# Patient Record
Sex: Male | Born: 1960 | Race: White | Hispanic: No | Marital: Married | Smoking: Never smoker
Health system: Southern US, Community
[De-identification: ages and names within clinical notes are randomized; demographics above are authoritative.]

## PROBLEM LIST (undated history)

## (undated) DIAGNOSIS — R112 Nausea with vomiting, unspecified: Secondary | ICD-10-CM

## (undated) DIAGNOSIS — E785 Hyperlipidemia, unspecified: Secondary | ICD-10-CM

## (undated) DIAGNOSIS — Z9889 Other specified postprocedural states: Secondary | ICD-10-CM

## (undated) DIAGNOSIS — K449 Diaphragmatic hernia without obstruction or gangrene: Secondary | ICD-10-CM

## (undated) DIAGNOSIS — K219 Gastro-esophageal reflux disease without esophagitis: Secondary | ICD-10-CM

## (undated) DIAGNOSIS — I10 Essential (primary) hypertension: Secondary | ICD-10-CM

## (undated) DIAGNOSIS — K5732 Diverticulitis of large intestine without perforation or abscess without bleeding: Secondary | ICD-10-CM

## (undated) HISTORY — DX: Gastro-esophageal reflux disease without esophagitis: K21.9

## (undated) HISTORY — DX: Hyperlipidemia, unspecified: E78.5

## (undated) HISTORY — DX: Diverticulitis of large intestine without perforation or abscess without bleeding: K57.32

## (undated) HISTORY — DX: Diaphragmatic hernia without obstruction or gangrene: K44.9

## (undated) HISTORY — DX: Essential (primary) hypertension: I10

## (undated) HISTORY — PX: ESOPHAGOGASTRODUODENOSCOPY: SHX1529

---

## 2000-05-14 ENCOUNTER — Encounter: Payer: Self-pay | Admitting: Emergency Medicine

## 2000-05-14 ENCOUNTER — Emergency Department (HOSPITAL_COMMUNITY): Admission: EM | Admit: 2000-05-14 | Discharge: 2000-05-14 | Payer: Self-pay | Admitting: Emergency Medicine

## 2000-05-15 ENCOUNTER — Encounter: Payer: Self-pay | Admitting: Urology

## 2000-05-15 ENCOUNTER — Ambulatory Visit (HOSPITAL_COMMUNITY): Admission: RE | Admit: 2000-05-15 | Discharge: 2000-05-15 | Payer: Self-pay | Admitting: Urology

## 2000-05-27 ENCOUNTER — Encounter: Admission: RE | Admit: 2000-05-27 | Discharge: 2000-05-27 | Payer: Self-pay | Admitting: Urology

## 2000-05-27 ENCOUNTER — Encounter: Payer: Self-pay | Admitting: Urology

## 2001-02-16 ENCOUNTER — Ambulatory Visit (HOSPITAL_COMMUNITY): Admission: RE | Admit: 2001-02-16 | Discharge: 2001-02-16 | Payer: Self-pay | Admitting: Gastroenterology

## 2005-01-22 ENCOUNTER — Ambulatory Visit: Payer: Self-pay | Admitting: Family Medicine

## 2005-03-12 ENCOUNTER — Ambulatory Visit: Payer: Self-pay | Admitting: Family Medicine

## 2005-04-15 ENCOUNTER — Ambulatory Visit: Payer: Self-pay | Admitting: Family Medicine

## 2005-06-05 ENCOUNTER — Ambulatory Visit: Payer: Self-pay | Admitting: Family Medicine

## 2005-08-15 ENCOUNTER — Ambulatory Visit: Payer: Self-pay | Admitting: Family Medicine

## 2005-11-26 ENCOUNTER — Ambulatory Visit: Payer: Self-pay | Admitting: Family Medicine

## 2006-03-13 ENCOUNTER — Ambulatory Visit: Payer: Self-pay | Admitting: Family Medicine

## 2006-04-30 ENCOUNTER — Ambulatory Visit: Payer: Self-pay | Admitting: Family Medicine

## 2006-07-12 ENCOUNTER — Encounter (INDEPENDENT_AMBULATORY_CARE_PROVIDER_SITE_OTHER): Payer: Self-pay | Admitting: Specialist

## 2006-07-12 ENCOUNTER — Ambulatory Visit (HOSPITAL_COMMUNITY): Admission: EM | Admit: 2006-07-12 | Discharge: 2006-07-13 | Payer: Self-pay | Admitting: Emergency Medicine

## 2006-09-02 ENCOUNTER — Ambulatory Visit: Payer: Self-pay | Admitting: Family Medicine

## 2007-01-13 ENCOUNTER — Ambulatory Visit: Payer: Self-pay | Admitting: Family Medicine

## 2007-06-02 ENCOUNTER — Ambulatory Visit: Payer: Self-pay | Admitting: Family Medicine

## 2007-07-08 IMAGING — CT CT ABDOMEN W/ CM
2 of 5 series · 17 of 46 positions shown, 19 images · IV contrast (APPLIED)
Comparison: None.

ABDOMEN CT WITH CONTRAST:

CLINICAL DATA: Abdominal pain.
TECHNIQUE: Multidetector CT imaging of the abdomen and pelvis was performed
following the standard protocol during bolus administration of intravenous
contrast.

Contrast:  125 cc Omnipaque 300

[Series 2: abd_pel 5.0 b40f st · axial · 0.68mm/px · z∈[-470,-50]mm · 14 of 96 slices shown, 16 images]
[im 6/96  soft-tissue]
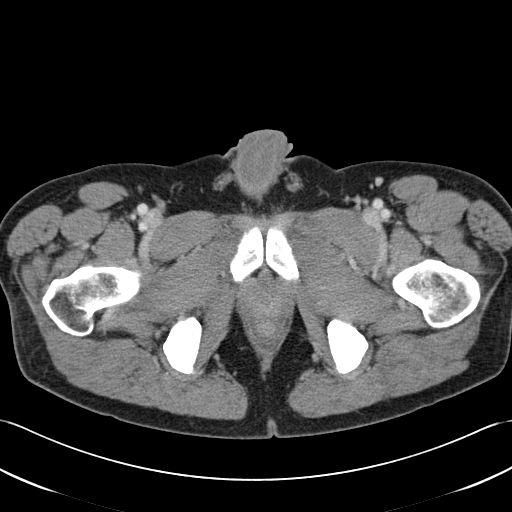
[im 6/96  bone]
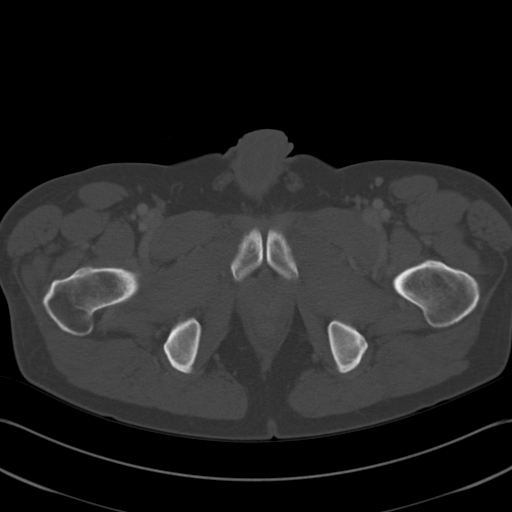
[im 11/96  soft-tissue]
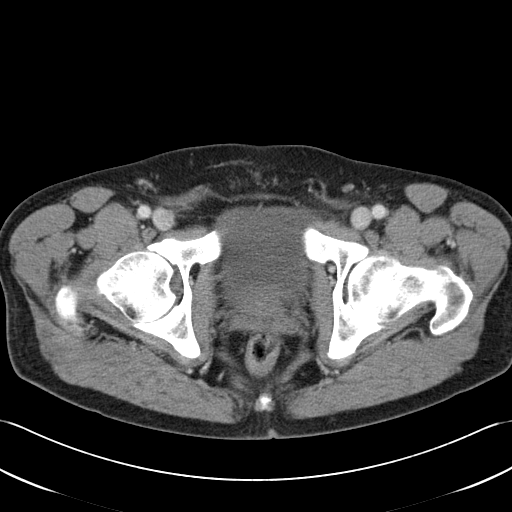
[im 22/96  soft-tissue]
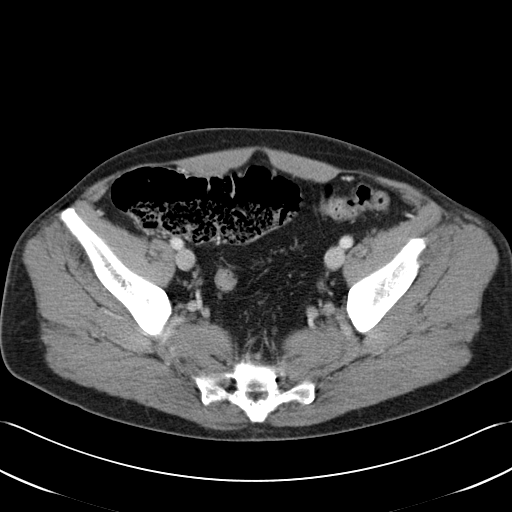
[im 27/96  soft-tissue]
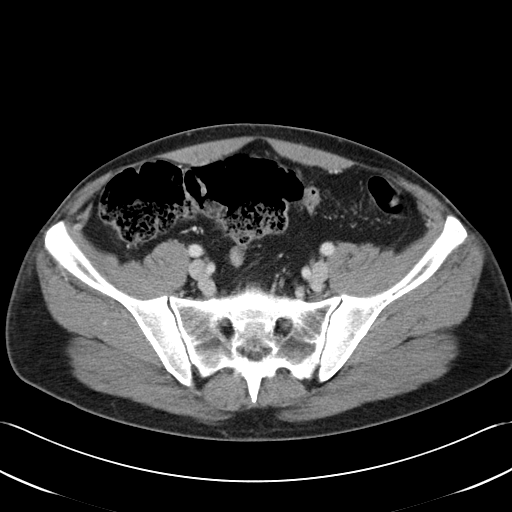
[im 32/96  soft-tissue]
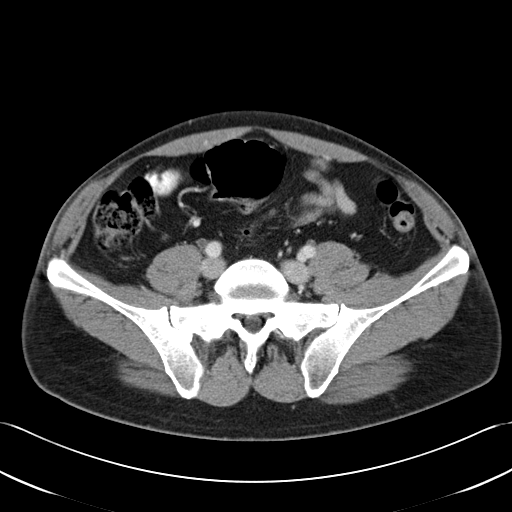
[im 37/96  soft-tissue]
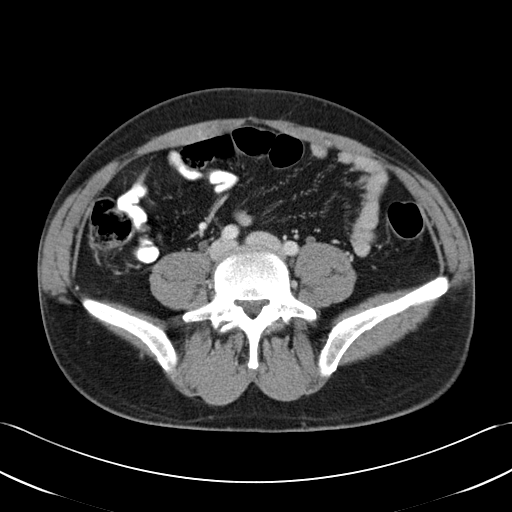
[im 43/96  soft-tissue]
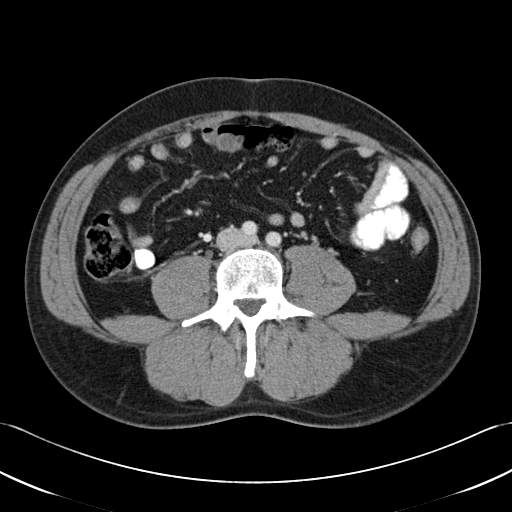
[im 53/96  soft-tissue]
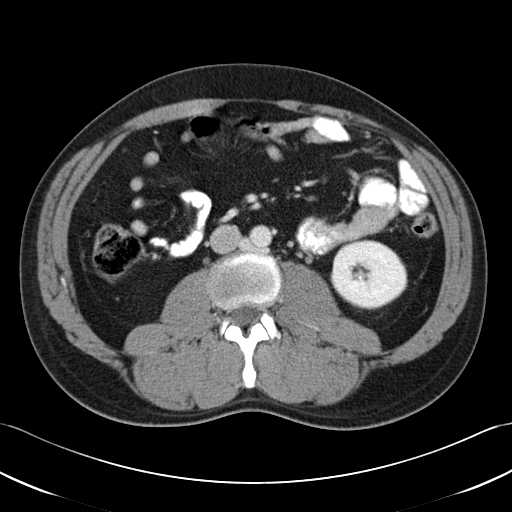
[im 59/96  soft-tissue]
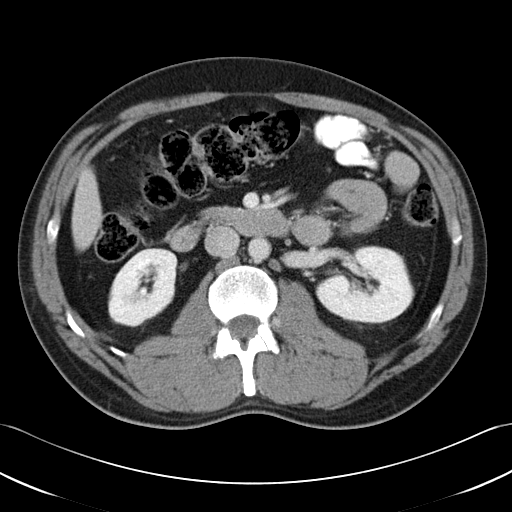
[im 59/96  bone]
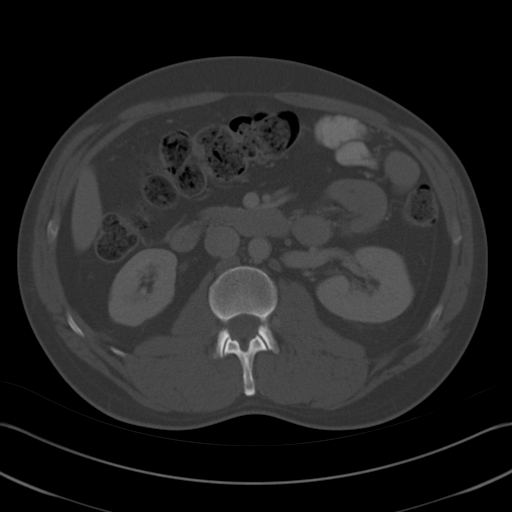
[im 64/96  soft-tissue]
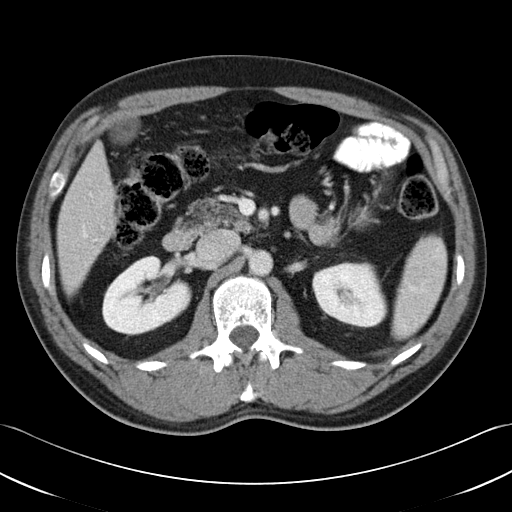
[im 69/96  soft-tissue]
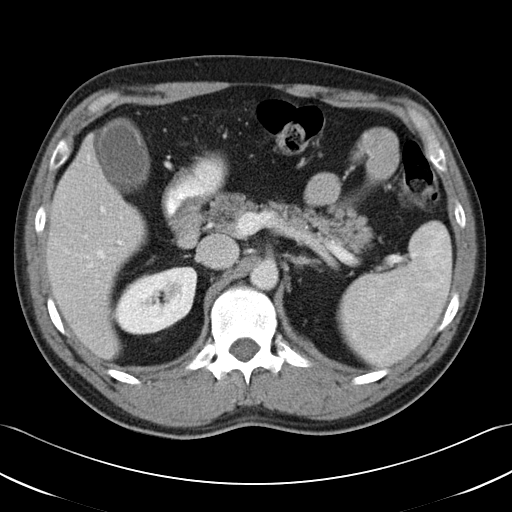
[im 74/96  soft-tissue]
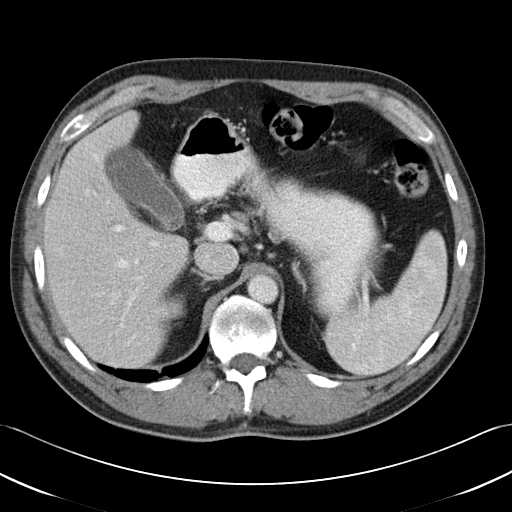
[im 85/96  soft-tissue]
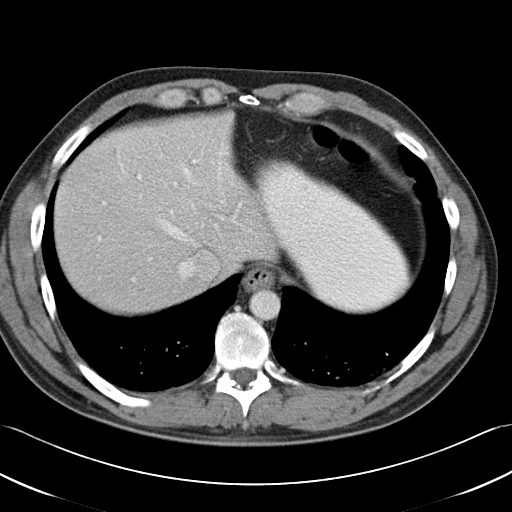
[im 90/96  soft-tissue]
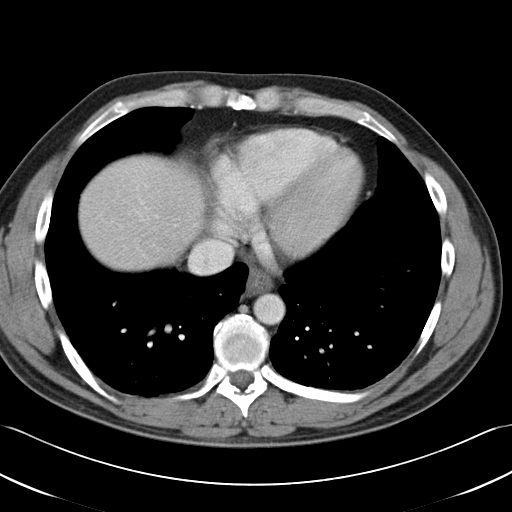

[Series 602: coronal abdomen · coronal · 0.97mm/px · 3 of 125 slices shown]
[im 42/125  soft-tissue]
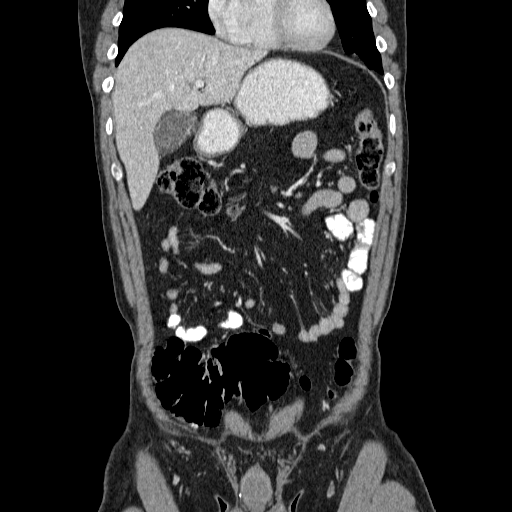
[im 56/125  soft-tissue]
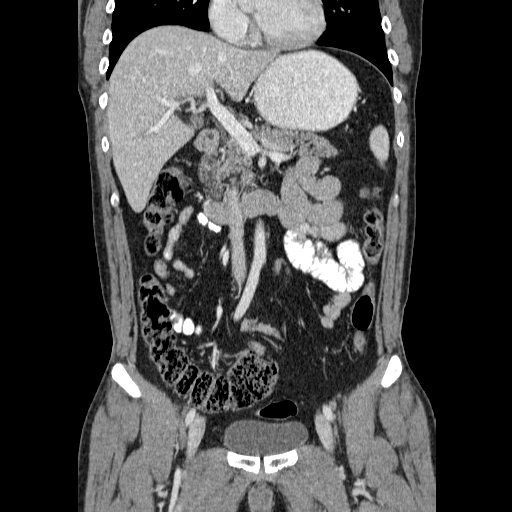
[im 69/125  soft-tissue]
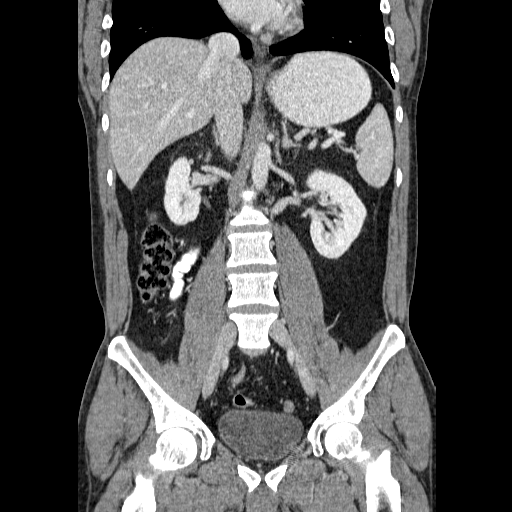

[17 of 46 positions shown; findings below may reference images not displayed]

FINDINGS: The liver, spleen, stomach, duodenum, pancreas, adrenal glands, and
kidneys are unremarkable. No free fluid, lymphadenopathy, or abdominal aortic
aneurysm.

Gallstones are noted in the gallbladder lumen. There is irregular gallbladder
wall thickening.
IMPRESSION: Gallstones with gallbladder wall thickening and pericholecystic edema. Features
are suspicious for acute cholecystitis and clinical correlation is recommended.

PELVIS CT WITH CONTRAST:

No lymphadenopathy or free fluid. Diverticular change noted in the sigmoid colon
without diverticulitis. The appendix is not visualized but there is no evidence
for right lower quadrant inflammation.
IMPRESSION: No acute findings in the anatomic pelvis. 

This report was called directly to Dr. Takiyah at the time of study
interpretation.

## 2011-05-10 NOTE — Consult Note (Signed)
Brian Walters, [name]:  [id number]   MEDICAL RECORD NO.:  [id number]          PATIENT TYPE:  EMS   LOCATION:  ED                           FACILITY:  [facility]   PHYSICIAN:  Lebron Conners, M.D.   DATE OF BIRTH:  1961/08/06   DATE OF CONSULTATION:  07/12/2006  DATE OF DISCHARGE:                                   CONSULTATION   CHIEF COMPLAINT:  Abdominal and back pain.   PRESENT ILLNESS:  Brian Walters is a healthy 50 year old man who was awakened  from sleep about 1:30 in the morning with severe right upper quadrant and  right-sided back pain.  It was so bad and he could not get relief that he  came to the emergency department.  He was given a pain shot which relieved  the pain and is now totally pain free.  He vomited several times.  He had  never had any similar attacks of illness before.  He underwent lab testing  which was all normal as far as CBC, serum chemistries, liver tests and  urinalysis.  He had a CT scan of the abdomen which showed gallstones with  thickened gallbladder wall consistent with acute cholecystitis.  He has no  history of jaundice, dark urine or light stools.   PAST MEDICAL HISTORY:  He has had no major illnesses or operations.  He is  on cholesterol-reducing medication for hyperlipidemia.  He has had no major  operations.  He is a smoker.  Does not drink alcoholic beverages.  He takes  no nonprescription drugs regularly.   FAMILY HISTORY:  Unremarkable.   REVIEW OF SYSTEMS:  He has had some indigestion and indigestion, not severe,  just lately.  The remainder of the review of systems is unremarkable  including symptoms of cardiopulmonary disease.   PHYSICAL EXAMINATION:  VITAL SIGNS:  Temperature is normal.  Vital signs  normal per nursing report.  GENERAL:  Patient is in no acute distress.  He is conversant, and mental  status is normal.  HEENT:  Unremarkable.  CHEST:  His chest is clear to auscultation.  HEART:  The heart rate  and rhythm are normal.  No murmur or gallop is heard.  ABDOMEN:  Completely nontender, nondistended, normal bowel sounds.  No  masses or organomegaly.  No hernias.  EXTREMITIES:  Normal with good pulses, no edema, no skin lesions.  SKIN:  No lesions noted.  LYMPH NODES:  Not enlarged in the groin, axilla, or neck.  NEUROLOGICAL:  Grossly normal.   IMPRESSION:  Acute cholecystitis and cholelithiasis, resolving.   PLAN:  I have talked to the patient and his wife at length about the  advantages of immediate operation versus elective surgery.  I mentioned  possibilities of pancreatitis and common duct obstruction which might lead  to jaundice and more serious trouble.  I mentioned the possibility that he  would have further attacks at inconvenient time.  The patient is feeling  very well right now, and he has just started a new very responsible job  which he wants work at full-time if he can.  I  believe it is alright to give  him a narcotic prescription, and I will give him some  Vicodin and to observe for the time being.  I have urged him to electively  schedule laparoscopic cholecystectomy in the not distant future.  We will  provide him with a pamphlet on gallstone disease and and will schedule the  operation when he wants to do so and will be happy to see him again at any  time in the future.      Lebron Conners, M.D.  Electronically Signed     WB/MEDQ  D:  07/12/2006  T:  07/12/2006  Job:  [id number]

## 2011-05-10 NOTE — Op Note (Signed)
NAMEOTTO, FELKINS              ACCOUNT NO.:  [id number]   MEDICAL RECORD NO.:  [id number]          PATIENT TYPE:  INP   LOCATION:  1617                         FACILITY:  [facility]   PHYSICIAN:  Lebron Conners, M.D.   DATE OF BIRTH:  18-May-1961   DATE OF PROCEDURE:  07/13/2006  DATE OF DISCHARGE:  07/13/2006                                 OPERATIVE REPORT   PREOPERATIVE DIAGNOSIS:  Cholelithiasis and acute cholecystitis.   POSTOPERATIVE DIAGNOSIS:  Cholelithiasis and acute cholecystitis.   OPERATION:  Laparoscopic cholecystectomy.   SURGEON:  Dr. Orson Slick   ASSISTANT:  Wilmon Arms. Tsuei, M.D.   ANESTHESIA:  General and local.   BLOOD LOSS:  About 200 mL.   CONDITION PACU:  Good.   COMPLICATIONS:  None.   PROCEDURE:  After the patient was monitored and asleep and had routine  preparation and draping of the abdomen.  I made a short infraumbilical  incision through a site where I infiltrated local anesthetic.  I cut the  midline fascia for about 2 cm and bluntly entered the peritoneum.  I placed  0 Vicryl pursestring suture and secured a Hassan cannula and inflated the  abdomen with carbon dioxide.  I saw no abnormalities on laparoscopy except  for a distended gallbladder.  I anesthetized three additional sites and  placed three ports under direct view noting that there was no injury to the  viscera.  I then had the patient positioned head-up foot-down tilted to the  left and I drained the gallbladder with a suction aspirator.  I then grasped  the fundus and elevated it toward the right shoulder.  There were no  adhesions of omentum to the gallbladder.  There was good deal of edema  around the infundibulum and hepatoduodenal ligament.  I carefully dissected  in that area and I saw that the cystic duct emerging from the infundibulum.  I was searching for the cystic artery when some bleeding occurred and I was  having a little difficulty seeing it for control.  I went ahead and  dissected the cystic duct a bit further then clipped it with four clips and  divided it as I saw it clearly emerging from the infundibulum.  I then was  able to dissect out the cystic artery and clipped and divided and get good  hemostasis.  I very carefully examined the cystic duct stump and saw no  evidence that there was any common duct injury.  Subsequently after removal  of the gallbladder, I examined the infundibulum and area of transection and  there was a single-lumen appropriate in size and no evidence of any duct  injury.  I then dissected the gallbladder from the liver using the cautery  and gained hemostasis with cautery.  I thought there was still maybe slight  oozing around the area of the cystic artery and so I packed Surgicel to  that.  Prior to that I thoroughly irrigated the area and removed irrigant.  After I was satisfied with hemostasis and the clips were secure and that  there was no leakage of bile.  I removed  the gallbladder from the abdomen in  a plastic pouch through the  umbilical incision and tied the pursestring suture.  I then removed the  lateral ports under direct view and saw no bleeding from the belly wall.  I  then closed all skin incisions with intracuticular Vicryl after allowing the  carbon dioxide to escape and removing epigastric port.  The patient  tolerated the operation well.      Lebron Conners, M.D.  Electronically Signed     WB/MEDQ  D:  07/13/2006  T:  07/13/2006  Job:  [id number]

## 2018-02-13 LAB — HM HEPATITIS C SCREENING LAB: HM Hepatitis Screen: NEGATIVE

## 2018-10-02 ENCOUNTER — Telehealth: Payer: Self-pay | Admitting: *Deleted

## 2018-10-02 NOTE — Telephone Encounter (Signed)
REFERRAL SENT TO SCHEDULING AND NOTES ON FILE FROM [facility] [phone].

## 2018-11-18 ENCOUNTER — Ambulatory Visit: Payer: BC Managed Care – PPO | Admitting: Cardiology

## 2018-11-18 ENCOUNTER — Encounter: Payer: Self-pay | Admitting: Cardiology

## 2018-11-18 DIAGNOSIS — I10 Essential (primary) hypertension: Secondary | ICD-10-CM | POA: Diagnosis not present

## 2018-11-18 DIAGNOSIS — I479 Paroxysmal tachycardia, unspecified: Secondary | ICD-10-CM

## 2018-11-18 DIAGNOSIS — R079 Chest pain, unspecified: Secondary | ICD-10-CM | POA: Insufficient documentation

## 2018-11-18 DIAGNOSIS — E785 Hyperlipidemia, unspecified: Secondary | ICD-10-CM | POA: Diagnosis not present

## 2018-11-18 DIAGNOSIS — R0602 Shortness of breath: Secondary | ICD-10-CM

## 2018-11-18 NOTE — Assessment & Plan Note (Signed)
Clearly his blood pressure not well enough controlled on just amlodipine alone.  Unfortunately when to see how he does it without the amlodipine since he thinks he is having side effects from it. Plan: Hold amlodipine for 2 days and then restart while not on Protonix.  If his symptoms recur, then we will switch from amlodipine to carvedilol 6.25 mg twice daily.  If symptoms do not recur, continue on amlodipine with plans to potentially add carvedilol on follow-up.

## 2018-11-18 NOTE — Patient Instructions (Signed)
Medication Instructions:   DO NOT TAKE  PROTONIX OR AMLODIPINE  FOR NOW  DO NOT TAKE AMLODIPINE FOR 3 DAYS , THEN RESTART  AMLODIPINE  IF SYMPTOMS DO NOT RETURN CONTINUE WITH THE AMLODIPINE IF SYMPTOMS RETURN CALL THE OFFICE  TO INFORM AND MEDICATION WILL BE CHANGED TO SOMETHING ELSE- DO NOT GO WITHOUT TAKING THIS SOMETHING      If you need a refill on your cardiac medications before your next appointment, please call your pharmacy.   Lab work:  NOT NEEDED  If you have labs (blood work) drawn today and your tests are completely normal, you will receive your results only by: Marland Kitchen. MyChart Message (if you have MyChart) OR . A paper copy in the mail If you have any lab test that is abnormal or we need to change your treatment, we will call you to review the results.  Testing/Procedures:  SCHEDULE AT [address] Your physician has requested that you have en exercise stress myoview. For further information please visit [email or web address]. Please follow instruction sheet, as given.    Follow-Up: At [facility], you and your health needs are our priority.  As part of our continuing mission to provide you with exceptional heart care, we have created designated Provider Care Teams.  These Care Teams include your primary Cardiologist (physician) and Advanced Practice Providers (APPs -  Physician Assistants and Nurse Practitioners) who all work together to provide you with the care you need, when you need it. You will need a follow up appointment in 2 months JAN 2020.  Please call our office 2 months in advance to schedule this appointment.  You may see Bryan Lemmaavid Harding, MD or one of the following Advanced Practice Providers on your designated Care Team:   Theodore DemarkRhonda Barrett, PA-C . Joni ReiningKathryn Lawrence, DNP, ANP  Any Other Special Instructions Will Be Listed Below (If Applicable).

## 2018-11-18 NOTE — Assessment & Plan Note (Signed)
Intermittent episodes of tachycardia.  Listen to this again in follow-up and and determine if any further needs to be done, but plan for now will be to actually see if he does okay with amlodipine alone, if not would start carvedilol 6.25 mg twice daily. If symptoms are worse, would probably consider monitor.

## 2018-11-18 NOTE — Assessment & Plan Note (Signed)
Has been on Livalo, now currently on pravastatin.  He has been on them for long enough time and I do not think that his new symptoms are related to pravastatin.  We will continue to monitor.

## 2018-11-18 NOTE — Assessment & Plan Note (Signed)
Hard to figure out with the symptoms are.  Taking away the fact it is not exacerbated with activity, it does sound like it could be atypical anginal type symptoms. With hyper tension and hyperlipidemia in a 57 year old gentleman, at least moderate risk for cardiac etiology. Plan: Treadmill Myoview.  Would like to see his exercise tolerance, but also think that are warranted

## 2018-11-18 NOTE — Assessment & Plan Note (Signed)
At this point, will evaluate for ischemia.  We will do this with a Myoview stress test, this will allow us to see his overall cardiac function as well as any potential ischemia.

## 2018-11-18 NOTE — Progress Notes (Signed)
PCP: Brian Catching, MD  Clinic Note: Chief Complaint  Patient presents with  . New Patient (Initial Visit)    Chest pain/throat pain    HPI: Brian Walters is a 57 y.o. male with a PMH notable for GERD / Esophagitis who presents today for the evaluation of chest pain and shortness of breath at the request of Brian Catching, MD.  TEE RICHESON was last seen on 10/12/2018: HPI states:  "Patient comes in for follow up, saw the PA a few weeks ago who increased his hydrochlorothiazide to a whole tablet and increased omeprazole to 20mg  BID. yesterday when eating a peanut butter sandwich he burped a lot and felt it coming back up his throat. Doesn't feel like food gets stuck or lodged. The pain from his stomach moves up to his throat.feels pressure in his stomach. Feels like he cant breathe when this happens. He gets the same feeling every time he takes hydrochlorothiazide. He went to the ER before he saw the PA and they did two ECGs with no significant change and trended troponins which were negative. He also brings in his Bps today which have been elevated.his bowels were loose as well this morning" -- converted to Protonix 40 mg, d/c HCTZ & started amlodipine 5 mg.  PRN [facility] + GI referral.  Recent Hospitalizations:  ER visit September 04, 2018: Thought to be viral gastroenteritis with shortness of breath.  Studies Personally Reviewed - (if available, images/films reviewed: From Epic Chart or Care Everywhere)  None    Interval History: Brian Walters presents here today stating that he still having episodes of difficulty breathing and choking sensation.  He says that whenever he takes either 1 of his medicines, he feels this choking sensation up in his throat that he cannot catch his breath.  Is satiated occasionally with tightness in his chest.  He also notes that whenever he touched tries to eat or drink something he will feel like he loses his breath.  He does note having  history of deviated septum, but has not had similar symptoms with this in the past.  He does not notice that he has exertional dyspnea, in fact he says that sometimes when he gets up walks around he gets better.  He never has chest tightness without dyspnea.  He says that with these episodes he just feels chills all over and the tingling sensation of chills that starts in the inside and goes out to his hands and legs lasting several minutes for goes away.  No fevers He says occasionally he feels his heart rate going up fast spontaneously almost like he is getting excited about something and then accompanied by down again.  It does not seem to be beating irregular, just fast.  Basically it sounds like he thinks that all of his symptoms are related to different medications that he is on.  He does not leave the Protonix is doing any good, and states that he is really having similar symptoms whether he is taking the amlodipine or the HCTZ.  He denies any PND orthopnea.  No edema.  Although he notes rapid heart rates he denies any syncope or near syncope just some lightheadedness.  No TIA or amaurosis fugax.  No claudication.  ROS: A comprehensive was performed.  Pertinent symptoms noted above Review of Systems  Constitutional: Positive for chills and malaise/fatigue. Negative for fever.  HENT: Positive for congestion. Negative for nosebleeds.   Respiratory: Positive for cough and sputum production.  Gastrointestinal: Negative for blood in stool and melena.  Genitourinary: Negative for dysuria and hematuria.  Musculoskeletal: Positive for back pain. Negative for joint pain.  Neurological: Positive for dizziness, tingling and weakness (Global). Negative for focal weakness.  Endo/Heme/Allergies: Positive for environmental allergies (Possibly).  Psychiatric/Behavioral: The patient is nervous/anxious.   All other systems reviewed and are negative.   I have reviewed and (if needed) personally updated  the patient's problem list, medications, allergies, past medical and surgical history, social and family history.   History reviewed. No pertinent past medical history.  History reviewed. No pertinent surgical history.  Current Meds  Medication Sig  . amLODipine (NORVASC) 5 MG tablet Take 2.5 mg by mouth daily.  . pantoprazole (PROTONIX) 40 MG tablet Take 40 mg by mouth daily.  . pravastatin (PRAVACHOL) 80 MG tablet Take 80 mg by mouth daily.    Not on File  Social History   Tobacco Use  . Smoking status: Never Smoker  . Smokeless tobacco: Never Used  Substance Use Topics  . Alcohol use: Not on file  . Drug use: Never   Social History   Social History Narrative  . Not on file    family history includes Diabetes in his mother; Heart attack in his father; Hypertension in his maternal grandfather and mother; Stroke in his maternal grandfather.  Wt Readings from Last 3 Encounters:  11/18/18 166 lb (75.3 kg)    PHYSICAL EXAM BP (!) 158/100   Pulse 89   Ht 5\' 8"  (1.727 m)   Wt 166 lb (75.3 kg)   BMI 25.24 kg/m  Physical Exam  Constitutional: He is oriented to person, place, and time. He appears well-developed and well-nourished. No distress.  Well-nourished, well-groomed  HENT:  Head: Normocephalic and atraumatic.  Mouth/Throat: Oropharynx is clear and moist.  Eyes: Pupils are equal, round, and reactive to light. Conjunctivae and EOM are normal.  Neck: Normal range of motion. Neck supple. No hepatojugular reflux and no JVD present. Carotid bruit is not present. No thyromegaly present.  Cardiovascular: Regular rhythm and intact distal pulses.  Occasional extrasystoles are present. Tachycardia present. PMI is not displaced. Exam reveals distant heart sounds. Exam reveals no gallop and no friction rub.  No murmur heard. Heart rate seems faster on exam the noted on vital signs and EKG.  Pulmonary/Chest: Effort normal and breath sounds normal. No respiratory distress. He  has no wheezes. He has no rales. He exhibits tenderness.  Abdominal: Soft. Bowel sounds are normal. He exhibits no distension. There is no tenderness. There is no rebound.  No HSM.  Musculoskeletal: Normal range of motion. He exhibits edema (Trivial). He exhibits no deformity.  Neurological: He is alert and oriented to person, place, and time. No cranial nerve deficit.  Skin: Skin is warm and dry. No rash noted. No erythema.  Psychiatric: He has a normal mood and affect. His behavior is normal. Judgment and thought content normal.     Adult ECG Report  Rate: 89;  Rhythm: normal sinus rhythm and Normal axis, intervals and durations.;   Narrative Interpretation: Normal EKG   Other studies Reviewed: Additional studies/ records that were reviewed today include:  Recent Labs:  No results found for: CREATININE, BUN, NA, K, CL, CO2 No results found for: CHOL, HDL, LDLCALC, LDLDIRECT, TRIG, CHOLHDL     ASSESSMENT / PLAN: Problem List Items Addressed This Visit    Chest pain with moderate risk for cardiac etiology    Hard to figure out with the symptoms  are.  Taking away the fact it is not exacerbated with activity, it does sound like it could be atypical anginal type symptoms. With hyper tension and hyperlipidemia in a 57 year old gentleman, at least moderate risk for cardiac etiology. Plan: Treadmill Myoview.  Would like to see his exercise tolerance, but also think that are warranted      Relevant Orders   EKG 12-Lead   MYOCARDIAL PERFUSION IMAGING   Essential hypertension (Chronic)    Clearly his blood pressure not well enough controlled on just amlodipine alone.  Unfortunately when to see how he does it without the amlodipine since he thinks he is having side effects from it. Plan: Hold amlodipine for 2 days and then restart while not on Protonix.  If his symptoms recur, then we will switch from amlodipine to carvedilol 6.25 mg twice daily.  If symptoms do not recur, continue on  amlodipine with plans to potentially add carvedilol on follow-up.      Relevant Medications   amLODipine (NORVASC) 5 MG tablet   pravastatin (PRAVACHOL) 80 MG tablet   Hyperlipidemia with target LDL less than 100 (Chronic)    Has been on Livalo, now currently on pravastatin.  He has been on them for long enough time and I do not think that his new symptoms are related to pravastatin.  We will continue to monitor.      Relevant Medications   amLODipine (NORVASC) 5 MG tablet   pravastatin (PRAVACHOL) 80 MG tablet   Paroxysmal tachycardia (HCC)    Intermittent episodes of tachycardia.  Listen to this again in follow-up and and determine if any further needs to be done, but plan for now will be to actually see if he does okay with amlodipine alone, if not would start carvedilol 6.25 mg twice daily. If symptoms are worse, would probably consider monitor.      Relevant Medications   amLODipine (NORVASC) 5 MG tablet   pravastatin (PRAVACHOL) 80 MG tablet   Other Relevant Orders   EKG 12-Lead   MYOCARDIAL PERFUSION IMAGING   Shortness of breath    At this point, will evaluate for ischemia.  We will do this with a Myoview stress test, this will allow us to see his overall cardiac function as well as any potential ischemia.      Relevant Orders   MYOCARDIAL PERFUSION IMAGING      I spent a total of 40 minutes with the patient and chart review. >  50% of the time was spent in direct patient consultation. New patient with additional charting to review, multiple different complaints.  Not a lot of history to go by.  Current medicines are reviewed at length with the patient today.  (+/- concerns) side effects from medicines noted The following changes have been made:  See below  Patient Instructions  Medication Instructions:   DO NOT TAKE  PROTONIX OR AMLODIPINE  FOR NOW  DO NOT TAKE AMLODIPINE FOR 3 DAYS , THEN RESTART  AMLODIPINE  IF SYMPTOMS DO NOT RETURN CONTINUE WITH THE AMLODIPINE  IF SYMPTOMS RETURN CALL THE OFFICE  TO INFORM AND MEDICATION WILL BE CHANGED TO SOMETHING ELSE- DO NOT GO WITHOUT TAKING THIS SOMETHING      If you need a refill on your cardiac medications before your next appointment, please call your pharmacy.   Lab work:  NOT NEEDED  If you have labs (blood work) drawn today and your tests are completely normal, you will receive your results only by: Marland Kitchen. MyChart  Message (if you have MyChart) OR . A paper copy in the mail If you have any lab test that is abnormal or we need to change your treatment, we will call you to review the results.  Testing/Procedures:  SCHEDULE AT [address] Your physician has requested that you have en exercise stress myoview. For further information please visit [email or web address] Please follow instruction sheet, as given.    Follow-Up: At [facility], you and your health needs are our priority.  As part of our continuing mission to provide you with exceptional heart care, we have created designated Provider Care Teams.  These Care Teams include your primary Cardiologist (physician) and Advanced Practice Providers (APPs -  Physician Assistants and Nurse Practitioners) who all work together to provide you with the care you need, when you need it. You will need a follow up appointment in 2 months JAN 2020.  Please call our office 2 months in advance to schedule this appointment.  You may see Bryan Lemma, MD or one of the following Advanced Practice Providers on your designated Care Team:   Theodore Demark, PA-C . Joni Reining, DNP, ANP  Any Other Special Instructions Will Be Listed Below (If Applicable).     Studies Ordered:   Orders Placed This Encounter  Procedures  . MYOCARDIAL PERFUSION IMAGING  . EKG 12-Lead      Bryan Lemma, M.D., M.S. Interventional Cardiologist   Pager # [phone] Phone # [phone] [address] [place], [place] [address]   Thank you  for choosing Heartcare at [facility]!!

## 2018-11-22 DIAGNOSIS — K5732 Diverticulitis of large intestine without perforation or abscess without bleeding: Secondary | ICD-10-CM

## 2018-11-22 DIAGNOSIS — K449 Diaphragmatic hernia without obstruction or gangrene: Secondary | ICD-10-CM

## 2018-11-22 HISTORY — PX: NM MYOVIEW LTD: HXRAD82

## 2018-11-22 HISTORY — DX: Diaphragmatic hernia without obstruction or gangrene: K44.9

## 2018-11-22 HISTORY — DX: Diverticulitis of large intestine without perforation or abscess without bleeding: K57.32

## 2018-12-17 ENCOUNTER — Telehealth (HOSPITAL_COMMUNITY): Payer: Self-pay

## 2018-12-17 NOTE — Telephone Encounter (Signed)
Encounter complete. 

## 2018-12-18 ENCOUNTER — Ambulatory Visit (HOSPITAL_COMMUNITY)
Admission: RE | Admit: 2018-12-18 | Discharge: 2018-12-18 | Disposition: A | Discharge status: Home / Self Care | Payer: BC Managed Care – PPO | Source: Ambulatory Visit | Attending: Cardiovascular Disease | Admitting: Cardiovascular Disease

## 2018-12-18 ENCOUNTER — Encounter (INDEPENDENT_AMBULATORY_CARE_PROVIDER_SITE_OTHER): Payer: Self-pay

## 2018-12-18 DIAGNOSIS — R079 Chest pain, unspecified: Secondary | ICD-10-CM | POA: Diagnosis present

## 2018-12-18 DIAGNOSIS — R0602 Shortness of breath: Secondary | ICD-10-CM

## 2018-12-18 DIAGNOSIS — I479 Paroxysmal tachycardia, unspecified: Secondary | ICD-10-CM | POA: Diagnosis present

## 2018-12-18 LAB — MYOCARDIAL PERFUSION IMAGING
CHL CUP NUCLEAR SDS: 2
CHL RATE OF PERCEIVED EXERTION: 19
Estimated workload: 10.1 METS
Exercise duration (min): 8 min
Exercise duration (sec): 1 s
LV sys vol: 35 mL
LVDIAVOL: 80 mL (ref 62–150)
MPHR: 165 {beats}/min
NUC STRESS TID: 0.98
Peak HR: 162 {beats}/min
Percent HR: 98 %
Rest HR: 70 {beats}/min
SRS: 0
SSS: 2

## 2018-12-18 MED ORDER — TECHNETIUM TC 99M TETROFOSMIN IV KIT
31.3000 | PACK | Freq: Once | INTRAVENOUS | Status: AC | PRN
  Administered 2018-12-18: 31.3 via INTRAVENOUS
  Filled 2018-12-18: qty 32

## 2018-12-18 MED ORDER — TECHNETIUM TC 99M TETROFOSMIN IV KIT
11.0000 | PACK | Freq: Once | INTRAVENOUS | Status: AC | PRN
  Administered 2018-12-18: 11 via INTRAVENOUS
  Filled 2018-12-18: qty 11

## 2018-12-22 ENCOUNTER — Telehealth: Payer: Self-pay | Admitting: *Deleted

## 2018-12-22 NOTE — Telephone Encounter (Signed)
LEFT MESSAGE TO CALL BACK - OR CAN WAIT UNTIL F/U APPOINTMENT 01/13/19 ROUTED TO PRIMARY

## 2018-12-22 NOTE — Telephone Encounter (Signed)
New Message    Hello Jasmine DecemberSharon sorry I couldn't locate you with patient on the phone but I got his cell phone number for you to call back on.

## 2018-12-22 NOTE — Telephone Encounter (Signed)
The patient has been notified of the result and verbalized understanding.  All questions (if any) were answered. Tobin ChadMartin, Sharon V, RN 12/22/2018 11:57 AM

## 2018-12-22 NOTE — Telephone Encounter (Signed)
-----   Message from Marykay Lexavid W Harding, MD sent at 12/21/2018  4:53 PM EST ----- LOW RISK nuclear stress test result.  No evidence of fixed heart artery blockage. This would argue against a cardiac reason for chest pain.   Bryan Lemmaavid Harding, MD  pls fwd to PCP: Joette CatchingNyland, Leonard, MD .

## 2019-01-13 ENCOUNTER — Encounter: Payer: Self-pay | Admitting: Cardiology

## 2019-01-13 ENCOUNTER — Ambulatory Visit: Payer: BC Managed Care – PPO | Admitting: Cardiology

## 2019-01-13 VITALS — BP 152/88 | HR 71 | Ht 68.0 in | Wt 170.2 lb

## 2019-01-13 DIAGNOSIS — I10 Essential (primary) hypertension: Secondary | ICD-10-CM | POA: Diagnosis not present

## 2019-01-13 DIAGNOSIS — R079 Chest pain, unspecified: Secondary | ICD-10-CM

## 2019-01-13 DIAGNOSIS — E785 Hyperlipidemia, unspecified: Secondary | ICD-10-CM | POA: Diagnosis not present

## 2019-01-13 DIAGNOSIS — I479 Paroxysmal tachycardia, unspecified: Secondary | ICD-10-CM | POA: Diagnosis not present

## 2019-01-13 NOTE — Patient Instructions (Signed)
Medication Instructions:  Not needed If you need a refill on your cardiac medications before your next appointment, please call your pharmacy.   Lab work: Not needed  If you have labs (blood work) drawn today and your tests are completely normal, you will receive your results only by: Marland Kitchen MyChart Message (if you have MyChart) OR . A paper copy in the mail If you have any lab test that is abnormal or we need to change your treatment, we will call you to review the results.  Testing/Procedures: Not needed  Follow-Up: At [facility], you and your health needs are our priority.  As part of our continuing mission to provide you with exceptional heart care, we have created designated Provider Care Teams.  These Care Teams include your primary Cardiologist (physician) and Advanced Practice Providers (APPs -  Physician Assistants and Nurse Practitioners) who all work together to provide you with the care you need, when you need it. You will need a follow up appointment in 24 months 2022 .  Please call our office 2 months in advance to schedule this appointment.  You may see Bryan Lemma, MD or one of the following Advanced Practice Providers on your designated Care Team:   Theodore Demark, PA-C . Joni Reining, DNP, ANP  Any Other Special Instructions Will Be Listed Below (If Applicable).

## 2019-01-13 NOTE — Progress Notes (Signed)
PCP: Joette CatchingNyland, Leonard, MD  Clinic Note: Chief Complaint  Patient presents with  . Follow-up    Myoview results  . Chest Pain    No longer present  . Tachycardia    No longer present    HPI: Brian Walters is a 58 y.o. male with a PMH notable for GERD / Esophagitis who presents today for the f/u evaluation of chest pain and shortness of breath, to discuss results of his stress test.  Originally seen in late November 2019 at the request of Joette CatchingNyland, Leonard, MD.  Brian Walters was initially seen back on November 29.  He was still noticing some evidence of difficulty breathing and choking sensation.  He noted some occasional chest tightness.  We evaluated this with a Myoview stress test.  Recent Hospitalizations:  EGD/Colon (Dr. Ewing SchleinMagod) - 3 each upper & lower polypectomy.  Small hiatal hernia.  Some mild diverticulitis.   Studies Personally Reviewed - (if available, images/films reviewed: From Epic Chart or Care Everywhere)  Myoview 11/2018: LOW RISK. No ischemia or infarct. 8 Min, 10.1 METS. HTN response to exercise.  EF 55-65%.     Interval History: Brian Walters presents here today to discuss the results of his Myoview ST. he states that he is started going back to the gym now and his symptoms seem to be getting better his shortness of breath has notably improved over the last 2 weeks and the chest discomfort is also getting better.  He is actually no longer taking the PPI having stopped it about a week ago. He says that he was started on the amlodipine which he is now only taking instead of taking it 5 mg days taking 1/2 tablet twice daily because it made him dizzy.  No longer noticing the choking sensation in his throat. No chest pain or pressure with rest or exertion.    Remainder of cardiac review of symptoms: No PND, orthopnea or edema.  No palpitations, lightheadedness, dizziness, weakness or syncope/near syncope. No TIA/amaurosis fugax symptoms. No claudication.  ROS: A  comprehensive was performed.  Pertinent symptoms noted above Review of Systems  Constitutional: Negative for chills and fever.  HENT: Positive for congestion.   Gastrointestinal: Positive for heartburn. Negative for nausea and vomiting.  Neurological: Positive for dizziness (Still little bit of orthostatic dizziness.  Better since he cut his amlodipine in half.). Negative for focal weakness and loss of consciousness.    I have reviewed and (if needed) personally updated the patient's problem list, medications, allergies, past medical and surgical history, social and family history.   Past Medical History:  Diagnosis Date  . Diverticulitis of colon without hemorrhage 11/2018   Noted on colonoscopy (mild)  . GERD (gastroesophageal reflux disease)   . Hiatal hernia without gangrene or obstruction 11/2018   Small hiatal hernia noted on EGD    Past Surgical History:  Procedure Laterality Date  . ESOPHAGOGASTRODUODENOSCOPY     3 polypectomy.  Small hiatal hernia.  Marland Kitchen. NM MYOVIEW LTD  11/2018   LOW RISK. No ischemia or infarct. 8 Min, 10.1 METS. HTN response to exercise.  EF 55-65%.      No outpatient medications have been marked as taking for the 01/13/19 encounter (Office Visit) with Marykay LexHarding, David W, MD.    No Known Allergies  Social History   Tobacco Use  . Smoking status: Never Smoker  . Smokeless tobacco: Never Used  Substance Use Topics  . Alcohol use: Not on file  . Drug use: Never  Social History   Social History Narrative  . Not on file    family history includes Diabetes in his mother; Heart attack in his father; Hypertension in his maternal grandfather and mother; Stroke in his maternal grandfather.  Wt Readings from Last 3 Encounters:  01/13/19 170 lb 3.2 oz (77.2 kg)  12/18/18 166 lb (75.3 kg)  11/18/18 166 lb (75.3 kg)    PHYSICAL EXAM BP (!) 152/88   Pulse 71   Ht 5\' 8"  (1.727 m)   Wt 170 lb 3.2 oz (77.2 kg)   BMI 25.88 kg/m  Physical Exam    Constitutional: He is oriented to person, place, and time. He appears well-developed and well-nourished. No distress.  Healthy-appearing.  HENT:  Head: Normocephalic and atraumatic.  Neck: Normal range of motion. Neck supple. No JVD present. Carotid bruit is not present.  Cardiovascular: Normal rate, regular rhythm, normal heart sounds and intact distal pulses.  No extrasystoles are present. PMI is not displaced. Exam reveals no gallop and no friction rub.  No murmur heard. Pulmonary/Chest: Effort normal and breath sounds normal. No respiratory distress. He has no wheezes. He has no rales.  Musculoskeletal:        General: No edema.  Neurological: He is alert and oriented to person, place, and time.  Psychiatric: He has a normal mood and affect. His behavior is normal. Judgment and thought content normal.  Vitals reviewed.    Adult ECG Report None  Other studies Reviewed: Additional studies/ records that were reviewed today include:  Recent Labs:  No results found for: CREATININE, BUN, NA, K, CL, CO2 No results found for: CHOL, HDL, LDLCALC, LDLDIRECT, TRIG, CHOLHDL     ASSESSMENT / PLAN: Problem List Items Addressed This Visit    Chest pain with low risk for cardiac etiology - Primary (Chronic)    Relatively low risk Myoview stress test.  Low likelihood that his chest discomfort was related to cardiac etiology. He did have a hypertensive response to exercise and therefore would probably want to follow his pressures closely.      Essential hypertension (Chronic)    Blood pressure is probably not well enough controlled on the amlodipine alone, but with him having some dizziness and splitting his amlodipine to 2-1/2 twice daily, I am reluctant to do any additional medications at this point.  Since I am not to be following him routinely (he would want to follow-up in at least 2 years just for reassessment), I do not want to be the one initiating a new medication and would therefore  defer to PCP.      Hyperlipidemia with target LDL less than 100 (Chronic)    Now on pravastatin.  Labs followed by PCP. Continue to monitor.      Paroxysmal tachycardia (HCC)    These episodes seem to be doing better overall. We will simply monitor for now, but if symptoms recur and he needs more blood pressure control, would probably consider adding carvedilol or metoprolol..        ROV 2 yrs per patient request  Current medicines are reviewed at length with the patient today.  (+/- concerns) side effects from medicines noted The following changes have been made:  See below  Patient Instructions  Medication Instructions:  Not needed If you need a refill on your cardiac medications before your next appointment, please call your pharmacy.   Lab work: Not needed  If you have labs (blood work) drawn today and your tests are completely normal,  you will receive your results only by: Marland Kitchen MyChart Message (if you have MyChart) OR . A paper copy in the mail If you have any lab test that is abnormal or we need to change your treatment, we will call you to review the results.  Testing/Procedures: Not needed  Follow-Up: At [facility], you and your health needs are our priority.  As part of our continuing mission to provide you with exceptional heart care, we have created designated Provider Care Teams.  These Care Teams include your primary Cardiologist (physician) and Advanced Practice Providers (APPs -  Physician Assistants and Nurse Practitioners) who all work together to provide you with the care you need, when you need it. You will need a follow up appointment in 24 months 2022 .  Please call our office 2 months in advance to schedule this appointment.  You may see Bryan Lemma, MD or one of the following Advanced Practice Providers on your designated Care Team:   Theodore Demark, PA-C . Joni Reining, DNP, ANP  Any Other Special Instructions Will Be Listed Below (If  Applicable).     Studies Ordered:   No orders of the defined types were placed in this encounter.     Bryan Lemma, M.D., M.S. Interventional Cardiologist   Pager # [phone] Phone # [phone] [address] [place], [place] [address]   Thank you for choosing Heartcare at [facility]!!

## 2019-01-16 ENCOUNTER — Encounter: Payer: Self-pay | Admitting: Cardiology

## 2019-01-16 NOTE — Assessment & Plan Note (Signed)
Blood pressure is probably not well enough controlled on the amlodipine alone, but with him having some dizziness and splitting his amlodipine to 2-1/2 twice daily, I am reluctant to do any additional medications at this point.  Since I am not to be following him routinely (he would want to follow-up in at least 2 years just for reassessment), I do not want to be the one initiating a new medication and would therefore defer to PCP.

## 2019-01-16 NOTE — Assessment & Plan Note (Signed)
These episodes seem to be doing better overall. We will simply monitor for now, but if symptoms recur and he needs more blood pressure control, would probably consider adding carvedilol or metoprolol.Marland Kitchen

## 2019-01-16 NOTE — Assessment & Plan Note (Signed)
Now on pravastatin.  Labs followed by PCP. Continue to monitor.

## 2019-01-16 NOTE — Assessment & Plan Note (Signed)
Relatively low risk Myoview stress test.  Low likelihood that his chest discomfort was related to cardiac etiology. He did have a hypertensive response to exercise and therefore would probably want to follow his pressures closely.

## 2019-09-09 ENCOUNTER — Other Ambulatory Visit: Payer: Self-pay

## 2019-09-09 ENCOUNTER — Ambulatory Visit: Payer: BC Managed Care – PPO | Admitting: Family Medicine

## 2019-09-09 ENCOUNTER — Encounter: Payer: Self-pay | Admitting: Family Medicine

## 2019-09-09 VITALS — BP 146/85 | HR 83 | Temp 98.7°F | Ht 68.0 in | Wt 175.4 lb

## 2019-09-09 DIAGNOSIS — I10 Essential (primary) hypertension: Secondary | ICD-10-CM

## 2019-09-09 DIAGNOSIS — Z23 Encounter for immunization: Secondary | ICD-10-CM | POA: Diagnosis not present

## 2019-09-09 DIAGNOSIS — E785 Hyperlipidemia, unspecified: Secondary | ICD-10-CM

## 2019-09-09 MED ORDER — AMLODIPINE BESYLATE 5 MG PO TABS: 2.5000 mg | ORAL_TABLET | Freq: Two times a day (BID) | ORAL | 3 refills | Status: DC

## 2019-09-09 MED ORDER — PRAVASTATIN SODIUM 80 MG PO TABS: 80.0000 mg | ORAL_TABLET | Freq: Every day | ORAL | 3 refills | Status: DC

## 2019-09-09 NOTE — Progress Notes (Signed)
BP (!) 146/85   Pulse 83   Temp 98.7 F (37.1 C) (Temporal)   Ht _0  (1.727 m)   Wt 175 lb 6.4 oz (79.6 kg)   SpO2 98%   BMI 26.67 kg/m    Subjective:    Patient ID: Brian Walters, male    DOB: 1961/02/20, 58 y.o.   MRN: [id number]  HPI: Brian Walters is a 58 y.o. male presenting on 09/09/2019 for New Patient (Initial Visit)   HPI Hypertension Patient is currently on amlodipine 2.5 mg twice daily, and their blood pressure today is 146/85. Patient denies any lightheadedness or dizziness. Patient denies headaches, blurred vision, chest pains, shortness of breath, or weakness. Denies any side effects from medication and is content with current medication.   Hyperlipidemia Patient is coming in for recheck of his hyperlipidemia. The patient is currently taking pravastatin. They deny any issues with myalgias or history of liver damage from it. They deny any focal numbness or weakness or chest pain.   Relevant past medical, surgical, family and social history reviewed and updated as indicated. Interim medical history since our last visit reviewed. Allergies and medications reviewed and updated.  Review of Systems  Constitutional: Negative for chills and fever.  Respiratory: Negative for shortness of breath and wheezing.   Cardiovascular: Negative for chest pain and leg swelling.  Musculoskeletal: Negative for back pain and gait problem.  Skin: Negative for rash.  Neurological: Negative for dizziness, weakness and light-headedness.  All other systems reviewed and are negative.   Per HPI unless specifically indicated above  Social History   Socioeconomic History  . Marital status: Married    Spouse name: Not on file  . Number of children: Not on file  . Years of education: Not on file  . Highest education level: Not on file  Occupational History  . Not on file  Social Needs  . Financial resource strain: Not on file  . Food insecurity    Worry: Not on file   Inability: Not on file  . Transportation needs    Medical: Not on file    Non-medical: Not on file  Tobacco Use  . Smoking status: Never Smoker  . Smokeless tobacco: Never Used  Substance and Sexual Activity  . Alcohol use: Not Currently  . Drug use: Never  . Sexual activity: Not on file  Lifestyle  . Physical activity    Days per week: Not on file    Minutes per session: Not on file  . Stress: Not on file  Relationships  . Social Herbalist on phone: Not on file    Gets together: Not on file    Attends religious service: Not on file    Active member of club or organization: Not on file    Attends meetings of clubs or organizations: Not on file    Relationship status: Not on file  . Intimate partner violence    Fear of current or ex partner: Not on file    Emotionally abused: Not on file    Physically abused: Not on file    Forced sexual activity: Not on file  Other Topics Concern  . Not on file  Social History Narrative  . Not on file    Past Surgical History:  Procedure Laterality Date  . ESOPHAGOGASTRODUODENOSCOPY     3 polypectomy.  Small hiatal hernia.  Marland Kitchen [facility] MY[facility]TD  11/2018   LOW RISK. No ischemia or infarct. 8  Min, 10.1 METS. HTN response to exercise.  EF 55-65%.      Family History  Problem Relation Age of Onset  . Hypertension Mother   . Diabetes Mother   . Heart attack Father   . Stroke Maternal Grandfather   . Hypertension Maternal Grandfather     Allergies as of 09/09/2019   No Known Allergies     Medication List       Accurate as of September 09, 2019 10:04 AM. If you have any questions, ask your nurse or doctor.        amLODipine 5 MG tablet Commonly known as: NORVASC Take 0.5 tablets (2.5 mg total) by mouth 2 (two) times daily.   pravastatin 80 MG tablet Commonly known as: PRAVACHOL Take 1 tablet (80 mg total) by mouth daily.          Objective:    BP (!) 146/85   Pulse 83   Temp 98.7 F (37.1 C) (Temporal)    Ht '5\' 8"'$  (1.727 m)   Wt 175 lb 6.4 oz (79.6 kg)   SpO2 98%   BMI 26.67 kg/m   Wt Readings from Last 3 Encounters:  09/09/19 175 lb 6.4 oz (79.6 kg)  01/13/19 170 lb 3.2 oz (77.2 kg)  12/18/18 166 lb (75.3 kg)    Physical Exam Vitals signs and nursing note reviewed.  Constitutional:      General: He is not in acute distress.    Appearance: He is well-developed. He is not diaphoretic.  Eyes:     General: No scleral icterus.    Conjunctiva/sclera: Conjunctivae normal.  Neck:     Musculoskeletal: Neck supple.     Thyroid: No thyromegaly.  Cardiovascular:     Rate and Rhythm: Normal rate and regular rhythm.     Heart sounds: Normal heart sounds. No murmur.  Pulmonary:     Effort: Pulmonary effort is normal. No respiratory distress.     Breath sounds: Normal breath sounds. No wheezing.  Musculoskeletal: Normal range of motion.  Lymphadenopathy:     Cervical: No cervical adenopathy.  Skin:    General: Skin is warm and dry.     Findings: No rash.  Neurological:     Mental Status: He is alert and oriented to person, place, and time.     Coordination: Coordination normal.  Psychiatric:        Behavior: Behavior normal.         Assessment & Plan:   Problem List Items Addressed This Visit      Cardiovascular and Mediastinum   Essential hypertension (Chronic)   Relevant Medications   pravastatin (PRAVACHOL) 80 MG tablet   amLODipine (NORVASC) 5 MG tablet   Other Relevant Orders   CBC with Differential/Platelet   CMP14+EGFR     Other   Hyperlipidemia with target LDL less than 100 - Primary (Chronic)   Relevant Medications   pravastatin (PRAVACHOL) 80 MG tablet   amLODipine (NORVASC) 5 MG tablet   Other Relevant Orders   CBC with Differential/Platelet   Lipid panel      Blood pressure likely elevated today because of mask and new clinic, he will keep a close eye on it over the next month and call in his blood pressures at home and then will reevaluate whether we  need to change anything. Continue pravastatin, will check blood work today Follow up plan: Return in about 6 months (around 03/08/2020), or if symptoms worsen or fail to improve, for physical and htn.  Caryl Pina, MD [facility] 09/09/2019, 10:04 AM

## 2019-09-09 NOTE — Addendum Note (Signed)
Addended by: Thana Ates on: 09/09/2019 11:03 AM   Modules accepted: Orders

## 2019-09-10 LAB — CBC WITH DIFFERENTIAL/PLATELET
Basophils Absolute: 0.1 10*3/uL (ref 0.0–0.2)
Basos: 1 %
EOS (ABSOLUTE): 0.3 10*3/uL (ref 0.0–0.4)
Eos: 3 %
Hematocrit: 46 % (ref 37.5–51.0)
Hemoglobin: 16.2 g/dL (ref 13.0–17.7)
Immature Grans (Abs): 0 10*3/uL (ref 0.0–0.1)
Immature Granulocytes: 0 %
Lymphocytes Absolute: 1.7 10*3/uL (ref 0.7–3.1)
Lymphs: 24 %
MCH: 31.5 pg (ref 26.6–33.0)
MCHC: 35.2 g/dL (ref 31.5–35.7)
MCV: 89 fL (ref 79–97)
Monocytes Absolute: 0.6 10*3/uL (ref 0.1–0.9)
Monocytes: 8 %
Neutrophils Absolute: 4.7 10*3/uL (ref 1.4–7.0)
Neutrophils: 64 %
Platelets: 136 10*3/uL — ABNORMAL LOW (ref 150–450)
RBC: 5.15 x10E6/uL (ref 4.14–5.80)
RDW: 12.1 % (ref 11.6–15.4)
WBC: 7.4 10*3/uL (ref 3.4–10.8)

## 2019-09-10 LAB — CMP14+EGFR
ALT: 27 IU/L (ref 0–44)
AST: 21 IU/L (ref 0–40)
Albumin/Globulin Ratio: 1.6 (ref 1.2–2.2)
Albumin: 4.3 g/dL (ref 3.8–4.9)
Alkaline Phosphatase: 113 IU/L (ref 39–117)
BUN/Creatinine Ratio: 13 (ref 9–20)
BUN: 12 mg/dL (ref 6–24)
Bilirubin Total: 0.5 mg/dL (ref 0.0–1.2)
CO2: 25 mmol/L (ref 20–29)
Calcium: 9.3 mg/dL (ref 8.7–10.2)
Chloride: 102 mmol/L (ref 96–106)
Creatinine, Ser: 0.9 mg/dL (ref 0.76–1.27)
GFR calc Af Amer: 110 mL/min/{1.73_m2} (ref >59)
GFR calc non Af Amer: 95 mL/min/{1.73_m2} (ref >59)
Globulin, Total: 2.7 g/dL (ref 1.5–4.5)
Glucose: 110 mg/dL — ABNORMAL HIGH (ref 65–99)
Potassium: 4.1 mmol/L (ref 3.5–5.2)
Sodium: 140 mmol/L (ref 134–144)
Total Protein: 7 g/dL (ref 6.0–8.5)

## 2019-09-10 LAB — LIPID PANEL
Chol/HDL Ratio: 3.9 ratio (ref 0.0–5.0)
Cholesterol, Total: 133 mg/dL (ref 100–199)
HDL: 34 mg/dL — ABNORMAL LOW (ref >39)
LDL Chol Calc (NIH): 81 mg/dL (ref 0–99)
Triglycerides: 92 mg/dL (ref 0–149)
VLDL Cholesterol Cal: 18 mg/dL (ref 5–40)

## 2020-02-19 ENCOUNTER — Ambulatory Visit: Payer: BC Managed Care – PPO | Attending: Internal Medicine

## 2020-02-19 DIAGNOSIS — Z23 Encounter for immunization: Secondary | ICD-10-CM | POA: Insufficient documentation

## 2020-02-19 NOTE — Progress Notes (Signed)
   Covid-19 Vaccination Clinic  Name:  Brian Walters    MRN: [id number] DOB: 10/03/1961  02/19/2020  [name] was observed post Covid-19 immunization for 15 minutes without incidence. He was provided with Vaccine Information Sheet and instruction to access the V-Safe system.   [name] was instructed to call 911 with any severe reactions post vaccine: Marland Kitchen Difficulty breathing  . Swelling of your face and throat  . A fast heartbeat  . A bad rash all over your body  . Dizziness and weakness    Immunizations Administered    Name Date Dose VIS Date Route   Pfizer COVID-19 Vaccine 02/19/2020  3:35 PM 0.3 mL 12/03/2019 Intramuscular   Manufacturer: [employer], [employer]   Lot: [id number]   NDC: [address]-5

## 2020-03-09 ENCOUNTER — Encounter: Payer: BC Managed Care – PPO | Admitting: Family Medicine

## 2020-03-11 ENCOUNTER — Ambulatory Visit: Payer: BC Managed Care – PPO | Attending: Internal Medicine

## 2020-03-11 DIAGNOSIS — Z23 Encounter for immunization: Secondary | ICD-10-CM

## 2020-03-11 NOTE — Progress Notes (Signed)
   Covid-19 Vaccination Clinic  Name:  KENO CARAWAY    MRN: [id number] DOB: 1961/10/18  03/11/2020  Mr. Kissick was observed post Covid-19 immunization for 15 minutes without incident. He was provided with Vaccine Information Sheet and instruction to access the V-Safe system.   Mr. Sobieski was instructed to call 911 with any severe reactions post vaccine: Marland Kitchen Difficulty breathing  . Swelling of face and throat  . A fast heartbeat  . A bad rash all over body  . Dizziness and weakness   Immunizations Administered    Name Date Dose VIS Date Route   Pfizer COVID-19 Vaccine 03/11/2020 12:48 PM 0.3 mL 12/03/2019 Intramuscular   Manufacturer: [employer], [employer]   Lot: [id number]   NDC: [address]-2

## 2020-04-21 ENCOUNTER — Ambulatory Visit (INDEPENDENT_AMBULATORY_CARE_PROVIDER_SITE_OTHER): Payer: BC Managed Care – PPO | Admitting: Family Medicine

## 2020-04-21 ENCOUNTER — Other Ambulatory Visit: Payer: Self-pay

## 2020-04-21 ENCOUNTER — Encounter: Payer: Self-pay | Admitting: Family Medicine

## 2020-04-21 VITALS — BP 140/90 | HR 79 | Temp 98.4°F | Ht 68.0 in | Wt 175.0 lb

## 2020-04-21 DIAGNOSIS — Z0000 Encounter for general adult medical examination without abnormal findings: Secondary | ICD-10-CM

## 2020-04-21 DIAGNOSIS — E785 Hyperlipidemia, unspecified: Secondary | ICD-10-CM

## 2020-04-21 DIAGNOSIS — I10 Essential (primary) hypertension: Secondary | ICD-10-CM | POA: Diagnosis not present

## 2020-04-21 DIAGNOSIS — Z0001 Encounter for general adult medical examination with abnormal findings: Secondary | ICD-10-CM | POA: Diagnosis not present

## 2020-04-21 DIAGNOSIS — Z125 Encounter for screening for malignant neoplasm of prostate: Secondary | ICD-10-CM

## 2020-04-21 MED ORDER — AMLODIPINE BESYLATE 5 MG PO TABS: ORAL_TABLET | ORAL | 3 refills | Status: DC

## 2020-04-21 NOTE — Progress Notes (Signed)
BP 140/90   Pulse 79   Temp 98.4 F (36.9 C)   Ht '5\' 8"'  (1.727 m)   Wt 79.4 kg   SpO2 96%   BMI 26.61 kg/m    Subjective:   Patient ID: Brian Walters, male    DOB: 02-10-1961, 59 y.o.   MRN: [id number]  HPI: Brian Walters is a 59 y.o. male presenting on 04/21/2020 for Medical Management of Chronic Issues (CPE)  Brian Walters presents to clinic today for a CPE and a follow up on his HTN and Hyperlipidemia.  1. HTN:  Brian Walters is taking 5 mg of Amlodipine daily for his HTN. He does check his BP at home and the numbers run around 130-140/85-95. He denies h/a, dizziness, and lightheadedness. Needs a refill of Amlodipine today.  2. Hyperlipidemia:  Brian Walters is taking 80 mg Pravastatin QD for his Hyperlipidemia. He denies any muscle aches. He does not need a refill of this medication today.  3. CPE:  Brian Walters has a Hiatal Hernia that causes him acid reflux if he eats too late at night. He is self-managing these symptoms by avoiding eating late at night and sleeping in a recliner if he does eat a late meal. Brian Walters had a colonoscopy and an EGD done in December 2019, which is when the Hiatal Hernia was discovered. Brian Walters was told to have a repeat colonoscopy in 5 years. Brian Walters has been noticing some blurry vision when reading things up close. 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 He denies eye pain.  Relevant past medical, surgical, family and social history reviewed and updated as indicated. Interim medical history since our last visit reviewed.  Allergies and medications reviewed and updated.  Review of Systems  Constitutional: Negative for unexpected weight change.  Eyes: Positive for visual disturbance. Negative for pain.       Blurry vision when reading up close.  Respiratory: Negative for shortness of breath.   Cardiovascular: Negative for chest pain.  Gastrointestinal: Negative for abdominal pain, nausea  and vomiting.       Patient reports burning/acid reflux symptoms (after eating later in the evening).  Musculoskeletal: Negative for arthralgias.  Neurological: Negative for dizziness, light-headedness and headaches.    Per HPI unless specifically indicated above   Allergies as of 04/21/2020   No Known Allergies     Medication List       Accurate as of April 21, 2020 10:27 AM. If you have any questions, ask your nurse or doctor.        amLODipine 5 MG tablet Commonly known as: NORVASC Take 0.5 tablets (2.5 mg total) by mouth 2 (two) times daily.   pravastatin 80 MG tablet Commonly known as: PRAVACHOL Take 1 tablet (80 mg total) by mouth daily.        Objective:   BP 140/90   Pulse 79   Temp 98.4 F (36.9 C)   Ht '5\' 8"'  (1.727 m)   Wt 79.4 kg   SpO2 96%   BMI 26.61 kg/m   Wt Readings from Last 3 Encounters:  04/21/20 79.4 kg  09/09/19 79.6 kg  01/13/19 77.2 kg    Physical Exam Constitutional:      General: He is not in acute distress.    Appearance: Normal appearance.  HENT:     Head: Normocephalic and atraumatic.     Right Ear: Tympanic membrane, ear canal and external  ear normal.     Left Ear: Tympanic membrane, ear canal and external ear normal.     Nose: Nose normal.     Mouth/Throat:     Mouth: Mucous membranes are moist.  Eyes:     Extraocular Movements: Extraocular movements intact.     Conjunctiva/sclera: Conjunctivae normal.     Pupils: Pupils are equal, round, and reactive to light.  Neck:     Comments: Normal thyroid palpated with no nodules or thyromegaly. Cardiovascular:     Rate and Rhythm: Normal rate and regular rhythm.     Pulses: Normal pulses.     Heart sounds: Normal heart sounds.     Comments: S1/S2 auscultated. Radial and DP pulses palpated. Pulmonary:     Effort: Pulmonary effort is normal.     Breath sounds: Normal breath sounds. No wheezing, rhonchi or rales.  Abdominal:     General: Bowel sounds are normal.      Palpations: Abdomen is soft.     Tenderness: There is no abdominal tenderness. There is no right CVA tenderness, left CVA tenderness or guarding.  Genitourinary:    Comments: Patient declined prostate exam today due to hemorrhoids. Skin:    General: Skin is warm and dry.  Neurological:     Mental Status: He is alert and oriented to person, place, and time.     Coordination: Coordination normal.     Gait: Gait normal.     Deep Tendon Reflexes:     Reflex Scores:      Patellar reflexes are 2+ on the right side and 2+ on the left side. Psychiatric:        Mood and Affect: Mood normal.        Behavior: Behavior normal.        Thought Content: Thought content normal.        Judgment: Judgment normal.    Assessment & Plan:   Problem List Items Addressed This Visit      Cardiovascular and Mediastinum   Essential hypertension (Chronic)   Relevant Medications   amLODipine (NORVASC) 5 MG tablet   Other Relevant Orders   CBC with Differential/Platelet (Completed)   CMP14+EGFR (Completed)   Lipid panel (Completed)     Other   Hyperlipidemia with target LDL less than 100 (Chronic)   Relevant Medications   amLODipine (NORVASC) 5 MG tablet    Other Visit Diagnoses    Well adult exam    -  Primary   Prostate cancer screening       Relevant Orders   PSA, total and free (Completed)      1. HTN:  Due to borderline HTN numbers despite current dose of Amlodipine, will increase Amlodipine to 7.5 mg QD (one pill in the morning and a half pill at night). Advised Brian Walters to continue monitoring his BP values at home and to let Dr. Warrick Parisian know if he starts to feel dizziness, lightheadedness, or faint.  2. Hyperlipidemia:  Continue Pravastatin 80 mg QD. Will check Lipid Panel today.  3. CPE:  Encouraged Brian Walters to make an appointment with the eye doctor to address blurry vision. Recommended OTC Pepcid or Prilosec for the acid reflux symptoms. Will check CBC, CMP, and PSA  today. Next follow up appointment in 6 months for a HTN and Hyperlipidemia recheck.  Follow up plan:  Follow up in 6 months for a HTN and Hyperlipidemia recheck or earlier if symptoms worsen or fail to improve, or if new symptoms arise.  Orders Placed This Encounter  Procedures  . CBC with Differential/Platelet  . CMP14+EGFR  . Lipid panel  . PSA, total and free    Gaynelle Arabian, PA-S[facility] 04/21/2020, 10:27 AM   Patient seen and examined with Gaynelle Arabian, PA student.  Patient has been having blurred vision and recommended to go see an eye doctor and he will make that appointment.  We recommended Pepcid or Prilosec over-the-counter for his occasional heartburn that he can use intermittently. Caryl Pina, MD [facility] 04/26/2020, 10:48 PM

## 2020-04-22 LAB — CBC WITH DIFFERENTIAL/PLATELET
Basophils Absolute: 0.1 10*3/uL (ref 0.0–0.2)
Basos: 1 %
EOS (ABSOLUTE): 0.2 10*3/uL (ref 0.0–0.4)
Eos: 2 %
Hematocrit: 47.5 % (ref 37.5–51.0)
Hemoglobin: 16.5 g/dL (ref 13.0–17.7)
Immature Grans (Abs): 0 10*3/uL (ref 0.0–0.1)
Immature Granulocytes: 0 %
Lymphocytes Absolute: 2.2 10*3/uL (ref 0.7–3.1)
Lymphs: 27 %
MCH: 32.2 pg (ref 26.6–33.0)
MCHC: 34.7 g/dL (ref 31.5–35.7)
MCV: 93 fL (ref 79–97)
Monocytes Absolute: 0.7 10*3/uL (ref 0.1–0.9)
Monocytes: 9 %
Neutrophils Absolute: 4.8 10*3/uL (ref 1.4–7.0)
Neutrophils: 61 %
Platelets: 121 10*3/uL — ABNORMAL LOW (ref 150–450)
RBC: 5.13 x10E6/uL (ref 4.14–5.80)
RDW: 12.4 % (ref 11.6–15.4)
WBC: 7.9 10*3/uL (ref 3.4–10.8)

## 2020-04-22 LAB — CMP14+EGFR
ALT: 29 IU/L (ref 0–44)
AST: 22 IU/L (ref 0–40)
Albumin/Globulin Ratio: 1.6 (ref 1.2–2.2)
Albumin: 4.3 g/dL (ref 3.8–4.9)
Alkaline Phosphatase: 123 IU/L — ABNORMAL HIGH (ref 39–117)
BUN/Creatinine Ratio: 13 (ref 9–20)
BUN: 13 mg/dL (ref 6–24)
Bilirubin Total: 0.4 mg/dL (ref 0.0–1.2)
CO2: 21 mmol/L (ref 20–29)
Calcium: 9.6 mg/dL (ref 8.7–10.2)
Chloride: 103 mmol/L (ref 96–106)
Creatinine, Ser: 0.98 mg/dL (ref 0.76–1.27)
GFR calc Af Amer: 99 mL/min/{1.73_m2} (ref >59)
GFR calc non Af Amer: 85 mL/min/{1.73_m2} (ref >59)
Globulin, Total: 2.7 g/dL (ref 1.5–4.5)
Glucose: 110 mg/dL — ABNORMAL HIGH (ref 65–99)
Potassium: 4.1 mmol/L (ref 3.5–5.2)
Sodium: 139 mmol/L (ref 134–144)
Total Protein: 7 g/dL (ref 6.0–8.5)

## 2020-04-22 LAB — PSA, TOTAL AND FREE
PSA, Free Pct: 22.9 %
PSA, Free: 0.32 ng/mL
Prostate Specific Ag, Serum: 1.4 ng/mL (ref 0.0–4.0)

## 2020-04-22 LAB — LIPID PANEL
Chol/HDL Ratio: 3.9 ratio (ref 0.0–5.0)
Cholesterol, Total: 131 mg/dL (ref 100–199)
HDL: 34 mg/dL — ABNORMAL LOW (ref >39)
LDL Chol Calc (NIH): 82 mg/dL (ref 0–99)
Triglycerides: 75 mg/dL (ref 0–149)
VLDL Cholesterol Cal: 15 mg/dL (ref 5–40)

## 2020-10-19 ENCOUNTER — Ambulatory Visit: Payer: BC Managed Care – PPO | Admitting: Family Medicine

## 2020-10-19 ENCOUNTER — Encounter: Payer: Self-pay | Admitting: Family Medicine

## 2020-10-19 ENCOUNTER — Other Ambulatory Visit: Payer: Self-pay

## 2020-10-19 VITALS — BP 149/92 | HR 83 | Temp 98.0°F | Ht 68.0 in | Wt 178.0 lb

## 2020-10-19 DIAGNOSIS — E785 Hyperlipidemia, unspecified: Secondary | ICD-10-CM | POA: Diagnosis not present

## 2020-10-19 DIAGNOSIS — I10 Essential (primary) hypertension: Secondary | ICD-10-CM | POA: Diagnosis not present

## 2020-10-19 DIAGNOSIS — Z23 Encounter for immunization: Secondary | ICD-10-CM | POA: Diagnosis not present

## 2020-10-19 LAB — CBC WITH DIFFERENTIAL/PLATELET
Basophils Absolute: 0.1 10*3/uL (ref 0.0–0.2)
Basos: 1 %
EOS (ABSOLUTE): 0.2 10*3/uL (ref 0.0–0.4)
Eos: 3 %
Hematocrit: 46.6 % (ref 37.5–51.0)
Hemoglobin: 16 g/dL (ref 13.0–17.7)
Immature Grans (Abs): 0 10*3/uL (ref 0.0–0.1)
Immature Granulocytes: 1 %
Lymphocytes Absolute: 1.9 10*3/uL (ref 0.7–3.1)
Lymphs: 25 %
MCH: 31 pg (ref 26.6–33.0)
MCHC: 34.3 g/dL (ref 31.5–35.7)
MCV: 90 fL (ref 79–97)
Monocytes Absolute: 0.7 10*3/uL (ref 0.1–0.9)
Monocytes: 9 %
Neutrophils Absolute: 4.7 10*3/uL (ref 1.4–7.0)
Neutrophils: 61 %
Platelets: 147 10*3/uL — ABNORMAL LOW (ref 150–450)
RBC: 5.16 x10E6/uL (ref 4.14–5.80)
RDW: 12.4 % (ref 11.6–15.4)
WBC: 7.6 10*3/uL (ref 3.4–10.8)

## 2020-10-19 LAB — CMP14+EGFR
ALT: 27 IU/L (ref 0–44)
AST: 20 IU/L (ref 0–40)
Albumin/Globulin Ratio: 1.6 (ref 1.2–2.2)
Albumin: 4.2 g/dL (ref 3.8–4.9)
Alkaline Phosphatase: 114 IU/L (ref 44–121)
BUN/Creatinine Ratio: 13 (ref 9–20)
BUN: 13 mg/dL (ref 6–24)
Bilirubin Total: 0.5 mg/dL (ref 0.0–1.2)
CO2: 24 mmol/L (ref 20–29)
Calcium: 9.4 mg/dL (ref 8.7–10.2)
Chloride: 101 mmol/L (ref 96–106)
Creatinine, Ser: 0.97 mg/dL (ref 0.76–1.27)
GFR calc Af Amer: 100 mL/min/{1.73_m2} (ref >59)
GFR calc non Af Amer: 86 mL/min/{1.73_m2} (ref >59)
Globulin, Total: 2.7 g/dL (ref 1.5–4.5)
Glucose: 109 mg/dL — ABNORMAL HIGH (ref 65–99)
Potassium: 3.9 mmol/L (ref 3.5–5.2)
Sodium: 137 mmol/L (ref 134–144)
Total Protein: 6.9 g/dL (ref 6.0–8.5)

## 2020-10-19 LAB — LIPID PANEL
Chol/HDL Ratio: 4.8 ratio (ref 0.0–5.0)
Cholesterol, Total: 152 mg/dL (ref 100–199)
HDL: 32 mg/dL — ABNORMAL LOW (ref >39)
LDL Chol Calc (NIH): 97 mg/dL (ref 0–99)
Triglycerides: 128 mg/dL (ref 0–149)
VLDL Cholesterol Cal: 23 mg/dL (ref 5–40)

## 2020-10-19 MED ORDER — PRAVASTATIN SODIUM 80 MG PO TABS: 80.0000 mg | ORAL_TABLET | Freq: Every day | ORAL | 3 refills | Status: DC

## 2020-10-19 MED ORDER — AMLODIPINE BESYLATE 5 MG PO TABS: ORAL_TABLET | ORAL | 3 refills | Status: DC

## 2020-10-19 NOTE — Progress Notes (Signed)
BP (!) 149/92   Pulse 83   Temp 98 F (36.7 C)   Ht '5\' 8"'  (1.727 m)   Wt 178 lb (80.7 kg)   SpO2 96%   BMI 27.06 kg/m    Subjective:   Patient ID: Brian Walters, male    DOB: 22-Sep-1961, 59 y.o.   MRN: [id number]  HPI: Brian Walters is a 59 y.o. male presenting on 10/19/2020 for Medical Management of Chronic Issues and Hypertension   HPI Hypertension Patient is currently on amlodipine, and their blood pressure today is 149/92. Patient denies any lightheadedness or dizziness. Patient denies headaches, blurred vision, chest pains, shortness of breath, or weakness. Denies any side effects from medication and is content with current medication.,  And blood pressures are slightly up here but Brian Walters says typically they run in the 130s over 90s at home pretty consistently.  Brian Walters will continue to monitor at home  Hyperlipidemia Patient is coming in for recheck of his hyperlipidemia. The patient is currently taking pravastatin. They deny any issues with myalgias or history of liver damage from it. They deny any focal numbness or weakness or chest pain.   Relevant past medical, surgical, family and social history reviewed and updated as indicated. Interim medical history since our last visit reviewed. Allergies and medications reviewed and updated.  Review of Systems  Constitutional: Negative for chills and fever.  Respiratory: Negative for shortness of breath and wheezing.   Cardiovascular: Negative for chest pain and leg swelling.  Musculoskeletal: Negative for back pain and gait problem.  Skin: Negative for rash.  Neurological: Negative for dizziness, weakness and numbness.  All other systems reviewed and are negative.   Per HPI unless specifically indicated above   Allergies as of 10/19/2020   No Known Allergies     Medication List       Accurate as of October 19, 2020  9:00 AM. If you have any questions, ask your nurse or doctor.        amLODipine 5 MG  tablet Commonly known as: NORVASC Take 5 mg in the morning and 2.5 mg in the evening for a total of 7.5 mg in the day.   pravastatin 80 MG tablet Commonly known as: PRAVACHOL Take 1 tablet (80 mg total) by mouth daily.        Objective:   BP (!) 149/92   Pulse 83   Temp 98 F (36.7 C)   Ht '5\' 8"'  (1.727 m)   Wt 178 lb (80.7 kg)   SpO2 96%   BMI 27.06 kg/m   Wt Readings from Last 3 Encounters:  10/19/20 178 lb (80.7 kg)  04/21/20 175 lb (79.4 kg)  09/09/19 175 lb 6.4 oz (79.6 kg)    Physical Exam Vitals and nursing note reviewed.  Constitutional:      General: Brian Walters is not in acute distress.    Appearance: Brian Walters is well-developed. Brian Walters is not diaphoretic.  Eyes:     General: No scleral icterus.    Conjunctiva/sclera: Conjunctivae normal.  Neck:     Thyroid: No thyromegaly.  Cardiovascular:     Rate and Rhythm: Normal rate and regular rhythm.     Heart sounds: Normal heart sounds. No murmur heard.   Pulmonary:     Effort: Pulmonary effort is normal. No respiratory distress.     Breath sounds: Normal breath sounds. No wheezing.  Musculoskeletal:        General: Normal range of motion.     Cervical  back: Neck supple.  Lymphadenopathy:     Cervical: No cervical adenopathy.  Skin:    General: Skin is warm and dry.     Findings: No rash.  Neurological:     Mental Status: Brian Walters is alert and oriented to person, place, and time.     Coordination: Coordination normal.  Psychiatric:        Behavior: Behavior normal.       Assessment & Plan:   Problem List Items Addressed This Visit      Cardiovascular and Mediastinum   Essential hypertension - Primary (Chronic)   Relevant Medications   amLODipine (NORVASC) 5 MG tablet   pravastatin (PRAVACHOL) 80 MG tablet   Other Relevant Orders   CBC with Differential/Platelet   CMP14+EGFR     Other   Hyperlipidemia with target LDL less than 100 (Chronic)   Relevant Medications   amLODipine (NORVASC) 5 MG tablet   pravastatin  (PRAVACHOL) 80 MG tablet   Other Relevant Orders   Lipid panel    Other Visit Diagnoses    Need for immunization against influenza       Relevant Orders   Flu Vaccine QUAD 36+ mos IM (Completed)      Continue current medication, continue to monitor blood pressure closely. Follow up plan: Return in about 1 year (around 10/19/2021), or if symptoms worsen or fail to improve, for Hypertension and hyperlipidemia.  Counseling provided for all of the vaccine components Orders Placed This Encounter  Procedures  . Flu Vaccine QUAD 36+ mos IM  . CBC with Differential/Platelet  . CMP14+EGFR  . Lipid panel    Caryl Pina, MD [facility] 10/19/2020, 9:00 AM

## 2020-10-23 ENCOUNTER — Ambulatory Visit: Payer: BC Managed Care – PPO | Admitting: Family Medicine

## 2021-10-19 ENCOUNTER — Ambulatory Visit: Payer: BC Managed Care – PPO | Admitting: Family Medicine

## 2021-10-19 ENCOUNTER — Encounter: Payer: Self-pay | Admitting: Family Medicine

## 2021-10-19 ENCOUNTER — Other Ambulatory Visit: Payer: Self-pay

## 2021-10-19 VITALS — BP 146/84 | HR 85 | Ht 68.0 in | Wt 173.0 lb

## 2021-10-19 DIAGNOSIS — I10 Essential (primary) hypertension: Secondary | ICD-10-CM

## 2021-10-19 DIAGNOSIS — Z0000 Encounter for general adult medical examination without abnormal findings: Secondary | ICD-10-CM

## 2021-10-19 DIAGNOSIS — Z23 Encounter for immunization: Secondary | ICD-10-CM | POA: Diagnosis not present

## 2021-10-19 DIAGNOSIS — Z125 Encounter for screening for malignant neoplasm of prostate: Secondary | ICD-10-CM

## 2021-10-19 DIAGNOSIS — E785 Hyperlipidemia, unspecified: Secondary | ICD-10-CM | POA: Diagnosis not present

## 2021-10-19 DIAGNOSIS — Z0001 Encounter for general adult medical examination with abnormal findings: Secondary | ICD-10-CM | POA: Diagnosis not present

## 2021-10-19 MED ORDER — PRAVASTATIN SODIUM 80 MG PO TABS: 80.0000 mg | ORAL_TABLET | Freq: Every day | ORAL | 3 refills | Status: DC

## 2021-10-19 MED ORDER — AMLODIPINE BESYLATE 5 MG PO TABS: ORAL_TABLET | ORAL | 3 refills | Status: DC

## 2021-10-19 NOTE — Progress Notes (Signed)
BP (!) 146/84   Pulse 85   Ht '5\' 8"'  (1.727 m)   Wt 173 lb (78.5 kg)   SpO2 98%   BMI 26.30 kg/m    Subjective:   Patient ID: Brian Walters, male    DOB: 02-16-1961, 60 y.o.   MRN: [id number]  HPI: Brian Walters is a 60 y.o. male presenting on 10/19/2021 for Medical Management of Chronic Issues and Hypertension    HPI Well adult exam Patient's only real issue is that his left foot is starting to have pain in his arch.  He does have very high arches and he knows that and its been challenging to find shoes and proper support.  Other than that he denies any major health issues. Patient denies any chest pain, shortness of breath, headaches or vision issues, abdominal complaints, diarrhea, nausea, vomiting, or joint issues.   Hypertension Patient is currently on amlodipine, and their blood pressure today is 146/84. Patient denies any lightheadedness or dizziness. Patient denies headaches, blurred vision, chest pains, shortness of breath, or weakness. Denies any side effects from medication and is content with current medication.   Hyperlipidemia Patient is coming in for recheck of his hyperlipidemia. The patient is currently taking pravastatin. They deny any issues with myalgias or history of liver damage from it. They deny any focal numbness or weakness or chest pain.   Relevant past medical, surgical, family and social history reviewed and updated as indicated. Interim medical history since our last visit reviewed. Allergies and medications reviewed and updated.  Review of Systems  Constitutional:  Negative for chills and fever.  HENT:  Negative for ear pain and tinnitus.   Eyes:  Negative for pain and discharge.  Respiratory:  Negative for cough, shortness of breath and wheezing.   Cardiovascular:  Negative for chest pain, palpitations and leg swelling.  Gastrointestinal:  Negative for abdominal pain, blood in stool, constipation and diarrhea.  Genitourinary:  Negative for  dysuria and hematuria.  Musculoskeletal:  Positive for arthralgias. Negative for back pain, gait problem and myalgias.  Skin:  Negative for rash.  Neurological:  Negative for dizziness, weakness and headaches.  Psychiatric/Behavioral:  Negative for suicidal ideas.   All other systems reviewed and are negative.  Per HPI unless specifically indicated above   Allergies as of 10/19/2021   No Known Allergies      Medication List        Accurate as of October 19, 2021  8:29 AM. If you have any questions, ask your nurse or doctor.          amLODipine 5 MG tablet Commonly known as: NORVASC Take 5 mg in the morning and 2.5 mg in the evening for a total of 7.5 mg in the day.   pravastatin 80 MG tablet Commonly known as: PRAVACHOL Take 1 tablet (80 mg total) by mouth daily.         Objective:   BP (!) 146/84   Pulse 85   Ht '5\' 8"'  (1.727 m)   Wt 173 lb (78.5 kg)   SpO2 98%   BMI 26.30 kg/m   Wt Readings from Last 3 Encounters:  10/19/21 173 lb (78.5 kg)  10/19/20 178 lb (80.7 kg)  04/21/20 175 lb (79.4 kg)    Physical Exam Vitals and nursing note reviewed.  Constitutional:      General: He is not in acute distress.    Appearance: He is well-developed. He is not diaphoretic.  HENT:  Right Ear: External ear normal.     Left Ear: External ear normal.     Nose: Nose normal.     Mouth/Throat:     Pharynx: No oropharyngeal exudate.  Eyes:     General: No scleral icterus.       Right eye: No discharge.     Conjunctiva/sclera: Conjunctivae normal.     Pupils: Pupils are equal, round, and reactive to light.  Neck:     Thyroid: No thyromegaly.  Cardiovascular:     Rate and Rhythm: Normal rate and regular rhythm.     Heart sounds: Normal heart sounds. No murmur heard. Pulmonary:     Effort: Pulmonary effort is normal. No respiratory distress.     Breath sounds: Normal breath sounds. No wheezing.  Abdominal:     General: Bowel sounds are normal. There is no  distension.     Palpations: Abdomen is soft.     Tenderness: There is no abdominal tenderness. There is no guarding or rebound.  Musculoskeletal:        General: Normal range of motion.     Cervical back: Neck supple.     Left foot: Normal range of motion. No tenderness (No tenderness today on exam but patient does have a very high arch on his left foot and likely needs better arch support) or bony tenderness.  Lymphadenopathy:     Cervical: No cervical adenopathy.  Skin:    General: Skin is warm and dry.     Findings: No rash.  Neurological:     Mental Status: He is alert and oriented to person, place, and time.     Coordination: Coordination normal.  Psychiatric:        Behavior: Behavior normal.      Assessment & Plan:   Problem List Items Addressed This Visit       Cardiovascular and Mediastinum   Essential hypertension (Chronic)   Relevant Medications   amLODipine (NORVASC) 5 MG tablet   pravastatin (PRAVACHOL) 80 MG tablet   Other Relevant Orders   CBC with Differential/Platelet   CMP14+EGFR     Other   Hyperlipidemia with target LDL less than 100 (Chronic)   Relevant Medications   amLODipine (NORVASC) 5 MG tablet   pravastatin (PRAVACHOL) 80 MG tablet   Other Relevant Orders   Lipid panel   Other Visit Diagnoses     Well adult exam    -  Primary   Relevant Orders   CBC with Differential/Platelet   CMP14+EGFR   Lipid panel   PSA, total and free   Prostate cancer screening       Relevant Orders   PSA, total and free       BP slightly elevated today but he says at home mostly in the 120s and 130s, will monitor closely and call me in some numbers of the next couple weeks. Follow up plan: Return in about 1 year (around 10/19/2022), or if symptoms worsen or fail to improve, for Physical.  Counseling provided for all of the vaccine components Orders Placed This Encounter  Procedures   CBC with Differential/Platelet   CMP14+EGFR   Lipid panel   PSA,  total and free    Caryl Pina, MD [facility] 10/19/2021, 8:29 AM

## 2021-10-20 LAB — CBC WITH DIFFERENTIAL/PLATELET
Basophils Absolute: 0.1 10*3/uL (ref 0.0–0.2)
Basos: 1 %
EOS (ABSOLUTE): 0.1 10*3/uL (ref 0.0–0.4)
Eos: 2 %
Hematocrit: 50.3 % (ref 37.5–51.0)
Hemoglobin: 17 g/dL (ref 13.0–17.7)
Immature Grans (Abs): 0.1 10*3/uL (ref 0.0–0.1)
Immature Granulocytes: 1 %
Lymphocytes Absolute: 1.8 10*3/uL (ref 0.7–3.1)
Lymphs: 23 %
MCH: 31.3 pg (ref 26.6–33.0)
MCHC: 33.8 g/dL (ref 31.5–35.7)
MCV: 93 fL (ref 79–97)
Monocytes Absolute: 0.7 10*3/uL (ref 0.1–0.9)
Monocytes: 9 %
Neutrophils Absolute: 4.9 10*3/uL (ref 1.4–7.0)
Neutrophils: 64 %
Platelets: 126 10*3/uL — ABNORMAL LOW (ref 150–450)
RBC: 5.43 x10E6/uL (ref 4.14–5.80)
RDW: 12.5 % (ref 11.6–15.4)
WBC: 7.7 10*3/uL (ref 3.4–10.8)

## 2021-10-20 LAB — CMP14+EGFR
ALT: 26 IU/L (ref 0–44)
AST: 22 IU/L (ref 0–40)
Albumin/Globulin Ratio: 1.8 (ref 1.2–2.2)
Albumin: 4.4 g/dL (ref 3.8–4.9)
Alkaline Phosphatase: 119 IU/L (ref 44–121)
BUN/Creatinine Ratio: 15 (ref 9–20)
BUN: 16 mg/dL (ref 6–24)
Bilirubin Total: 0.5 mg/dL (ref 0.0–1.2)
CO2: 24 mmol/L (ref 20–29)
Calcium: 9.6 mg/dL (ref 8.7–10.2)
Chloride: 101 mmol/L (ref 96–106)
Creatinine, Ser: 1.05 mg/dL (ref 0.76–1.27)
Globulin, Total: 2.4 g/dL (ref 1.5–4.5)
Glucose: 116 mg/dL — ABNORMAL HIGH (ref 70–99)
Potassium: 4.5 mmol/L (ref 3.5–5.2)
Sodium: 140 mmol/L (ref 134–144)
Total Protein: 6.8 g/dL (ref 6.0–8.5)
eGFR: 82 mL/min/{1.73_m2} (ref >59)

## 2021-10-20 LAB — LIPID PANEL
Chol/HDL Ratio: 4.1 ratio (ref 0.0–5.0)
Cholesterol, Total: 132 mg/dL (ref 100–199)
HDL: 32 mg/dL — ABNORMAL LOW (ref >39)
LDL Chol Calc (NIH): 80 mg/dL (ref 0–99)
Triglycerides: 107 mg/dL (ref 0–149)
VLDL Cholesterol Cal: 20 mg/dL (ref 5–40)

## 2021-10-20 LAB — PSA, TOTAL AND FREE
PSA, Free Pct: 23.8 %
PSA, Free: 0.31 ng/mL
Prostate Specific Ag, Serum: 1.3 ng/mL (ref 0.0–4.0)

## 2022-06-17 ENCOUNTER — Ambulatory Visit: Payer: BC Managed Care – PPO | Admitting: Family Medicine

## 2022-06-17 ENCOUNTER — Encounter: Payer: Self-pay | Admitting: Family Medicine

## 2022-06-17 VITALS — BP 130/85 | HR 87 | Temp 98.1°F | Ht 68.0 in | Wt 174.2 lb

## 2022-06-17 DIAGNOSIS — R300 Dysuria: Secondary | ICD-10-CM

## 2022-06-17 DIAGNOSIS — K4090 Unilateral inguinal hernia, without obstruction or gangrene, not specified as recurrent: Secondary | ICD-10-CM | POA: Diagnosis not present

## 2022-06-17 LAB — URINALYSIS, ROUTINE W REFLEX MICROSCOPIC
Bilirubin, UA: NEGATIVE
Glucose, UA: NEGATIVE
Ketones, UA: NEGATIVE
Leukocytes,UA: NEGATIVE
Nitrite, UA: NEGATIVE
Protein,UA: NEGATIVE
RBC, UA: NEGATIVE
Specific Gravity, UA: 1.015 (ref 1.005–1.030)
Urobilinogen, Ur: 1 mg/dL (ref 0.2–1.0)
pH, UA: 8.5 — ABNORMAL HIGH (ref 5.0–7.5)

## 2022-06-20 ENCOUNTER — Ambulatory Visit: Payer: BC Managed Care – PPO | Admitting: Surgery

## 2022-06-20 ENCOUNTER — Encounter: Payer: Self-pay | Admitting: Surgery

## 2022-06-20 VITALS — BP 157/87 | HR 68 | Temp 97.6°F | Resp 16 | Ht 68.0 in | Wt 175.0 lb

## 2022-06-20 DIAGNOSIS — K4090 Unilateral inguinal hernia, without obstruction or gangrene, not specified as recurrent: Secondary | ICD-10-CM

## 2022-06-20 NOTE — Progress Notes (Addendum)
[facility] History and Physical  Reason for Referral: Right inguinal hernia Referring Physician: Dr. Louanne Skye  Chief Complaint   New Patient (Initial Visit)     Brian Walters is a 60 y.o. male.  HPI: Patient presents for evaluation of a right inguinal hernia.  He noted increasing pain and burning in his right groin 5 months ago after he was chopping down a tree.  Since that time, he has progressively had worsening pain in his right groin.  He does occasionally note a bulge that is reducible.  His pain is worse after exercising or standing for prolonged periods of time.  He occasionally will have nausea, but denies vomiting or difficulty with bowel movements.  His past medical history is significant for hypertension and hyperlipidemia.  His surgical history is significant for cholecystectomy.  He denies use of blood thinning medications.  Denies use of tobacco products, alcohol, and illicit drugs.  Past Medical History:  Diagnosis Date   Diverticulitis of colon without hemorrhage 11/2018   Noted on colonoscopy (mild)   GERD (gastroesophageal reflux disease)    Hiatal hernia without gangrene or obstruction 11/2018   Small hiatal hernia noted on EGD   Hyperlipidemia    Hypertension     Past Surgical History:  Procedure Laterality Date   ESOPHAGOGASTRODUODENOSCOPY     3 polypectomy.  Small hiatal hernia.   NM MYOVIEW LTD  11/2018   LOW RISK. No ischemia or infarct. 8 Min, 10.1 METS. HTN response to exercise.  EF 55-65%.      Family History  Problem Relation Age of Onset   Hypertension Mother    Diabetes Mother    Heart attack Father    Stroke Maternal Grandfather    Hypertension Maternal Grandfather     Social History   Tobacco Use   Smoking status: Never   Smokeless tobacco: Never  Vaping Use   Vaping Use: Never used  Substance Use Topics   Alcohol use: Not Currently   Drug use: Never    Medications: I have reviewed the patient's current  medications. Allergies as of 06/20/2022   No Known Allergies      Medication List        Accurate as of June 20, 2022 12:09 PM. If you have any questions, ask your nurse or doctor.          amLODipine 5 MG tablet Commonly known as: NORVASC Take 5 mg in the morning and 2.5 mg in the evening for a total of 7.5 mg in the day.   pravastatin 80 MG tablet Commonly known as: PRAVACHOL Take 1 tablet (80 mg total) by mouth daily.         ROS:  Constitutional: negative for chills, fatigue, and fevers Eyes: negative for visual disturbance and pain Ears, nose, mouth, throat, and face: negative for ear drainage, sore throat, and sinus problems Respiratory: negative for cough, wheezing, and shortness of breath Cardiovascular: negative for chest pain and palpitations Gastrointestinal: negative for abdominal pain, nausea, reflux symptoms, and vomiting Genitourinary:negative for dysuria, frequency, and urinary retention Integument/breast: negative for dryness and rash Hematologic/lymphatic: negative for bleeding and lymphadenopathy Musculoskeletal:negative for back pain, neck pain, and joint pain Neurological: negative for dizziness, tremors, and numbness Endocrine: negative for temperature intolerance  Blood pressure (!) 157/87, pulse 68, temperature 97.6 F (36.4 C), temperature source Oral, resp. rate 16, height 5\' 8"  (1.727 m), weight 175 lb (79.4 kg), SpO2 97 %. Physical Exam Vitals reviewed.  Constitutional:  Appearance: Normal appearance.  Eyes:     Extraocular Movements: Extraocular movements intact.     Pupils: Pupils are equal, round, and reactive to light.  Cardiovascular:     Rate and Rhythm: Normal rate and regular rhythm.  Pulmonary:     Effort: Pulmonary effort is normal.     Breath sounds: Normal breath sounds.  Abdominal:     Comments: Abdomen soft, nondistended, no percussion tenderness, nontender to palpation; no rigidity, guarding, rebound tenderness;  no inguinal hernia on the left side, soft and reducible right inguinal hernia  Musculoskeletal:        General: Normal range of motion.     Cervical back: Normal range of motion.  Skin:    General: Skin is warm and dry.  Neurological:     General: No focal deficit present.     Mental Status: He is alert and oriented to person, place, and time.  Psychiatric:        Mood and Affect: Mood normal.        Behavior: Behavior normal.     Results: No results found for this or any previous visit (from the past 48 hour(s)).  No results found.   Assessment & Plan:  Brian Walters is a 61 y.o. male who presents for evaluation of right inguinal hernia.  -I explained the pathophysiology of hernias, and why we recommend surgical repair. -The risk and benefits of open right inguinal hernia repair with mesh were discussed including but not limited to bleeding, infection, injury to surrounding structures, hernia recurrence, need for additional procedure.  After careful consideration, Brian Walters has decided to open right inguinal hernia repair with mesh. -Advised the patient to take it easy in regards to activity between now and his hernia repair.  I also advised him that he will need to decrease his activity (no heavy lifting greater than 10 pounds) for 4 weeks after the surgery -Information provided to the patient regarding inguinal hernias -Advised the patient to present immediately to the emergency department if he has an irreducible right inguinal hernia, nausea, vomiting, and obstipation -Patient tentatively scheduled for surgery on 7/14   All questions were answered to the satisfaction of the patient.   Theophilus Kinds, DO [facility] [address] [place], [place] [address] [phone] (office)

## 2022-06-21 NOTE — H&P (Addendum)
[facility] History and Physical  Reason for Referral: Right inguinal hernia Referring Physician: Dr. Dettinger  Chief Complaint   New Patient (Initial Visit)     Brian Walters is a 61 y.o. male.  HPI: Patient presents for evaluation of a right inguinal hernia.  He noted increasing pain and burning in his right groin 5 months ago after he was chopping down a tree.  Since that time, he has progressively had worsening pain in his right groin.  He does occasionally note a bulge that is reducible.  His pain is worse after exercising or standing for prolonged periods of time.  He occasionally will have nausea, but denies vomiting or difficulty with bowel movements.  His past medical history is significant for hypertension and hyperlipidemia.  His surgical history is significant for cholecystectomy.  He denies use of blood thinning medications.  Denies use of tobacco products, alcohol, and illicit drugs.  Past Medical History:  Diagnosis Date   Diverticulitis of colon without hemorrhage 11/2018   Noted on colonoscopy (mild)   GERD (gastroesophageal reflux disease)    Hiatal hernia without gangrene or obstruction 11/2018   Small hiatal hernia noted on EGD   Hyperlipidemia    Hypertension     Past Surgical History:  Procedure Laterality Date   ESOPHAGOGASTRODUODENOSCOPY     3 polypectomy.  Small hiatal hernia.   NM MYOVIEW LTD  11/2018   LOW RISK. No ischemia or infarct. 8 Min, 10.1 METS. HTN response to exercise.  EF 55-65%.      Family History  Problem Relation Age of Onset   Hypertension Mother    Diabetes Mother    Heart attack Father    Stroke Maternal Grandfather    Hypertension Maternal Grandfather     Social History   Tobacco Use   Smoking status: Never   Smokeless tobacco: Never  Vaping Use   Vaping Use: Never used  Substance Use Topics   Alcohol use: Not Currently   Drug use: Never    Medications: I have reviewed the patient's current  medications. Allergies as of 06/20/2022   No Known Allergies      Medication List        Accurate as of June 20, 2022 12:09 PM. If you have any questions, ask your nurse or doctor.          amLODipine 5 MG tablet Commonly known as: NORVASC Take 5 mg in the morning and 2.5 mg in the evening for a total of 7.5 mg in the day.   pravastatin 80 MG tablet Commonly known as: PRAVACHOL Take 1 tablet (80 mg total) by mouth daily.         ROS:  Constitutional: negative for chills, fatigue, and fevers Eyes: negative for visual disturbance and pain Ears, nose, mouth, throat, and face: negative for ear drainage, sore throat, and sinus problems Respiratory: negative for cough, wheezing, and shortness of breath Cardiovascular: negative for chest pain and palpitations Gastrointestinal: negative for abdominal pain, nausea, reflux symptoms, and vomiting Genitourinary:negative for dysuria, frequency, and urinary retention Integument/breast: negative for dryness and rash Hematologic/lymphatic: negative for bleeding and lymphadenopathy Musculoskeletal:negative for back pain, neck pain, and joint pain Neurological: negative for dizziness, tremors, and numbness Endocrine: negative for temperature intolerance  Blood pressure (!) 157/87, pulse 68, temperature 97.6 F (36.4 C), temperature source Oral, resp. rate 16, height 5' 8" (1.727 m), weight 175 lb (79.4 kg), SpO2 97 %. Physical Exam Vitals reviewed.  Constitutional:        Appearance: Normal appearance.  Eyes:     Extraocular Movements: Extraocular movements intact.     Pupils: Pupils are equal, round, and reactive to light.  Cardiovascular:     Rate and Rhythm: Normal rate and regular rhythm.  Pulmonary:     Effort: Pulmonary effort is normal.     Breath sounds: Normal breath sounds.  Abdominal:     Comments: Abdomen soft, nondistended, no percussion tenderness, nontender to palpation; no rigidity, guarding, rebound tenderness;  no inguinal hernia on the left side, soft and reducible right inguinal hernia  Musculoskeletal:        General: Normal range of motion.     Cervical back: Normal range of motion.  Skin:    General: Skin is warm and dry.  Neurological:     General: No focal deficit present.     Mental Status: He is alert and oriented to person, place, and time.  Psychiatric:        Mood and Affect: Mood normal.        Behavior: Behavior normal.     Results: No results found for this or any previous visit (from the past 48 hour(s)).  No results found.   Assessment & Plan:  Nickoles H Neiswonger is a 61 y.o. male who presents for evaluation of right inguinal hernia.  -I explained the pathophysiology of hernias, and why we recommend surgical repair. -The risk and benefits of open right inguinal hernia repair with mesh were discussed including but not limited to bleeding, infection, injury to surrounding structures, hernia recurrence, need for additional procedure.  After careful consideration, Ojas H Pleitez has decided to open right inguinal hernia repair with mesh. -Advised the patient to take it easy in regards to activity between now and his hernia repair.  I also advised him that he will need to decrease his activity (no heavy lifting greater than 10 pounds) for 4 weeks after the surgery -Information provided to the patient regarding inguinal hernias -Advised the patient to present immediately to the emergency department if he has an irreducible right inguinal hernia, nausea, vomiting, and obstipation -Patient tentatively scheduled for surgery on 7/14   All questions were answered to the satisfaction of the patient.   Julissa Browning, DO [facility] [address] [place], [place] [address] [phone] (office)      

## 2022-07-01 NOTE — Patient Instructions (Signed)
Brian SalisburyJeffrey H Matthis  07/01/2022     @PREFPERIOPPHARMACY @   Your procedure is scheduled on  07/05/2022.   Report to [facility] at  0600  A.M.   Call this number if you have problems the morning of surgery:  [phone]   Remember:  Do not eat or drink after midnight.      Take these medicines the morning of surgery with A SIP OF WATER                                       Norvasc.      Do not wear jewelry, make-up or nail polish.  Do not wear lotions, powders, or perfumes, or deodorant.  Do not shave 48 hours prior to surgery.  Men may shave face and neck.  Do not bring valuables to the hospital.  [facility] not responsible for any belongings or valuables.  Contacts, dentures or bridgework may not be worn into surgery.  Leave your suitcase in the car.  After surgery it may be brought to your room.  For patients admitted to the hospital, discharge time will be determined by your treatment team.  Patients discharged the day of surgery will not be allowed to drive home and must have someone with them for 24 hours.    Special instructions:   DO NOT smoke tobacco or vape for 24 hours before your procedure.  Please read over the following fact sheets that you were given. Coughing and Deep Breathing, Surgical Site Infection Prevention, Anesthesia Post-op Instructions, and Care and Recovery After Surgery      Open Hernia Repair, Adult, Care After What can I expect after the procedure? After the procedure, it is common to have: Mild discomfort. Slight bruising. Mild swelling. Pain in the belly (abdomen). A small amount of blood from the cut from surgery (incision). Follow these instructions at home: Your doctor may give you more specific instructions. If you have problems, call your doctor. Medicines Take over-the-counter and prescription medicines only as told by your doctor. If told, take steps to prevent problems with pooping (constipation). You may  need to: Drink enough fluid to keep your pee (urine) pale yellow. Take medicines. You will be told what medicines to take. Eat foods that are high in fiber. These include beans, whole grains, and fresh fruits and vegetables. Limit foods that are high in fat and sugar. These include fried or sweet foods. Ask your doctor if you should avoid driving or using machines while you are taking your medicine. Incision care  Follow instructions from your doctor about how to take care of your incision. Make sure you: Wash your hands with soap and water for at least 20 seconds before and after you change your bandage (dressing). If you cannot use soap and water, use hand sanitizer. Change your bandage. Leave stitches or skin glue in place for at least 2 weeks. Leave tape strips alone unless you are told to take them off. You may trim the edges of the tape strips if they curl up. Check your incision every day for signs of infection. Check for: More redness, swelling, or pain. More fluid or blood. Warmth. Pus or a bad smell. Wear loose, soft clothing while your incision heals. Activity  Rest as told by your doctor. Do not lift anything that is heavier than 10 lb (4.5 kg),  or the limit that you are told. Do not play contact sports until your doctor says that this is safe. If you were given a sedative during your procedure, do not drive or use machines until your doctor says that it is safe. A sedative is a medicine that helps you relax. Return to your normal activities when your doctor says that it is safe. General instructions Do not take baths, swim, or use a hot tub. Ask your doctor about taking showers or sponge baths. Hold a pillow over your belly when you cough or sneeze. This helps with pain. Do not smoke or use any products that contain nicotine or tobacco. If you need help quitting, ask your doctor. Keep all follow-up visits. Contact a doctor if: You have any of these signs of infection in  or around your incision: More redness, swelling, or pain. More fluid or blood. Warmth. Pus. A bad smell. You have a fever or chills. You have blood in your poop (stool). You have not pooped (had a bowel movement) in 2-3 days. Medicine does not help your pain. Get help right away if: You have chest pain, or you are short of breath. You feel faint or light-headed. You have very bad pain. You vomit and your pain is worse. You have pain, swelling, or redness in a leg. These symptoms may be an emergency. Get help right away. Call your local emergency services (911 in the [place] Do not wait to see if the symptoms will go away. Do not drive yourself to the hospital. Summary After this procedure, it is common to have mild discomfort, slight bruising, and mild swelling. Follow instructions from your doctor about how to take care of your cut from surgery (incision). Check every day for signs of infection. Do not lift heavy objects or play contact sports until your doctor says it is safe. Return to your normal activities as told by your doctor. This information is not intended to replace advice given to you by your health care provider. Make sure you discuss any questions you have with your health care provider. Document Revised: 07/24/2020 Document Reviewed: 07/24/2020 Elsevier Patient Education  2023 Else[employer] Inc. General Anesthesia, Adult, Care After This sheet gives you information about how to care for yourself after your procedure. Your health care provider may also give you more specific instructions. If you have problems or questions, contact your health care provider. What can I expect after the procedure? After the procedure, the following side effects are common: Pain or discomfort at the IV site. Nausea. Vomiting. Sore throat. Trouble concentrating. Feeling cold or chills. Feeling weak or tired. Sleepiness and fatigue. Soreness and body aches. These side effects can affect  parts of the body that were not involved in surgery. Follow these instructions at home: For the time period you were told by your health care provider:  Rest. Do not participate in activities where you could fall or become injured. Do not drive or use machinery. Do not drink alcohol. Do not take sleeping pills or medicines that cause drowsiness. Do not make important decisions or sign legal documents. Do not take care of children on your own. Eating and drinking Follow any instructions from your health care provider about eating or drinking restrictions. When you feel hungry, start by eating small amounts of foods that are soft and easy to digest (bland), such as toast. Gradually return to your regular diet. Drink enough fluid to keep your urine pale yellow. If you vomit, rehydrate by  drinking water, juice, or clear broth. General instructions If you have sleep apnea, surgery and certain medicines can increase your risk for breathing problems. Follow instructions from your health care provider about wearing your sleep device: Anytime you are sleeping, including during daytime naps. While taking prescription pain medicines, sleeping medicines, or medicines that make you drowsy. Have a responsible adult stay with you for the time you are told. It is important to have someone help care for you until you are awake and alert. Return to your normal activities as told by your health care provider. Ask your health care provider what activities are safe for you. Take over-the-counter and prescription medicines only as told by your health care provider. If you smoke, do not smoke without supervision. Keep all follow-up visits as told by your health care provider. This is important. Contact a health care provider if: You have nausea or vomiting that does not get better with medicine. You cannot eat or drink without vomiting. You have pain that does not get better with medicine. You are unable to  pass urine. You develop a skin rash. You have a fever. You have redness around your IV site that gets worse. Get help right away if: You have difficulty breathing. You have chest pain. You have blood in your urine or stool, or you vomit blood. Summary After the procedure, it is common to have a sore throat or nausea. It is also common to feel tired. Have a responsible adult stay with you for the time you are told. It is important to have someone help care for you until you are awake and alert. When you feel hungry, start by eating small amounts of foods that are soft and easy to digest (bland), such as toast. Gradually return to your regular diet. Drink enough fluid to keep your urine pale yellow. Return to your normal activities as told by your health care provider. Ask your health care provider what activities are safe for you. This information is not intended to replace advice given to you by your health care provider. Make sure you discuss any questions you have with your health care provider. Document Revised: 08/24/2020 Document Reviewed: 03/23/2020 Elsevier Patient Education  2023 Elsevier Inc. How to Use Chlorhexidine for Bathing Chlorhexidine gluconate (CHG) is a germ-killing (antiseptic) solution that is used to clean the skin. It can get rid of the bacteria that normally live on the skin and can keep them away for about 24 hours. To clean your skin with CHG, you may be given: A CHG solution to use in the shower or as part of a sponge bath. A prepackaged cloth that contains CHG. Cleaning your skin with CHG may help lower the risk for infection: While you are staying in the intensive care unit of the hospital. If you have a vascular access, such as a central line, to provide short-term or long-term access to your veins. If you have a catheter to drain urine from your bladder. If you are on a ventilator. A ventilator is a machine that helps you breathe by moving air in and out of  your lungs. After surgery. What are the risks? Risks of using CHG include: A skin reaction. Hearing loss, if CHG gets in your ears and you have a perforated eardrum. Eye injury, if CHG gets in your eyes and is not rinsed out. The CHG product catching fire. Make sure that you avoid smoking and flames after applying CHG to your skin. Do not use CHG:  If you have a chlorhexidine allergy or have previously reacted to chlorhexidine. On babies younger than 57 months of age. How to use CHG solution Use CHG only as told by your health care provider, and follow the instructions on the label. Use the full amount of CHG as directed. Usually, this is one bottle. During a shower Follow these steps when using CHG solution during a shower (unless your health care provider gives you different instructions): Start the shower. Use your normal soap and shampoo to wash your face and hair. Turn off the shower or move out of the shower stream. Pour the CHG onto a clean washcloth. Do not use any type of brush or rough-edged sponge. Starting at your neck, lather your body down to your toes. Make sure you follow these instructions: If you will be having surgery, pay special attention to the part of your body where you will be having surgery. Scrub this area for at least 1 minute. Do not use CHG on your head or face. If the solution gets into your ears or eyes, rinse them well with water. Avoid your genital area. Avoid any areas of skin that have broken skin, cuts, or scrapes. Scrub your back and under your arms. Make sure to wash skin folds. Let the lather sit on your skin for 1-2 minutes or as long as told by your health care provider. Thoroughly rinse your entire body in the shower. Make sure that all body creases and crevices are rinsed well. Dry off with a clean towel. Do not put any substances on your body afterward--such as powder, lotion, or perfume--unless you are told to do so by your health care  provider. Only use lotions that are recommended by the manufacturer. Put on clean clothes or pajamas. If it is the night before your surgery, sleep in clean sheets.  During a sponge bath Follow these steps when using CHG solution during a sponge bath (unless your health care provider gives you different instructions): Use your normal soap and shampoo to wash your face and hair. Pour the CHG onto a clean washcloth. Starting at your neck, lather your body down to your toes. Make sure you follow these instructions: If you will be having surgery, pay special attention to the part of your body where you will be having surgery. Scrub this area for at least 1 minute. Do not use CHG on your head or face. If the solution gets into your ears or eyes, rinse them well with water. Avoid your genital area. Avoid any areas of skin that have broken skin, cuts, or scrapes. Scrub your back and under your arms. Make sure to wash skin folds. Let the lather sit on your skin for 1-2 minutes or as long as told by your health care provider. Using a different clean, wet washcloth, thoroughly rinse your entire body. Make sure that all body creases and crevices are rinsed well. Dry off with a clean towel. Do not put any substances on your body afterward--such as powder, lotion, or perfume--unless you are told to do so by your health care provider. Only use lotions that are recommended by the manufacturer. Put on clean clothes or pajamas. If it is the night before your surgery, sleep in clean sheets. How to use CHG prepackaged cloths Only use CHG cloths as told by your health care provider, and follow the instructions on the label. Use the CHG cloth on clean, dry skin. Do not use the CHG cloth on your head or  face unless your health care provider tells you to. When washing with the CHG cloth: Avoid your genital area. Avoid any areas of skin that have broken skin, cuts, or scrapes. Before surgery Follow these steps  when using a CHG cloth to clean before surgery (unless your health care provider gives you different instructions): Using the CHG cloth, vigorously scrub the part of your body where you will be having surgery. Scrub using a back-and-forth motion for 3 minutes. The area on your body should be completely wet with CHG when you are done scrubbing. Do not rinse. Discard the cloth and let the area air-dry. Do not put any substances on the area afterward, such as powder, lotion, or perfume. Put on clean clothes or pajamas. If it is the night before your surgery, sleep in clean sheets.  For general bathing Follow these steps when using CHG cloths for general bathing (unless your health care provider gives you different instructions). Use a separate CHG cloth for each area of your body. Make sure you wash between any folds of skin and between your fingers and toes. Wash your body in the following order, switching to a new cloth after each step: The front of your neck, shoulders, and chest. Both of your arms, under your arms, and your hands. Your stomach and groin area, avoiding the genitals. Your right leg and foot. Your left leg and foot. The back of your neck, your back, and your buttocks. Do not rinse. Discard the cloth and let the area air-dry. Do not put any substances on your body afterward--such as powder, lotion, or perfume--unless you are told to do so by your health care provider. Only use lotions that are recommended by the manufacturer. Put on clean clothes or pajamas. Contact a health care provider if: Your skin gets irritated after scrubbing. You have questions about using your solution or cloth. You swallow any chlorhexidine. Call your local poison control center (8307145356 in the [place] Get help right away if: Your eyes itch badly, or they become very red or swollen. Your skin itches badly and is red or swollen. Your hearing changes. You have trouble seeing. You have swelling or  tingling in your mouth or throat. You have trouble breathing. These symptoms may represent a serious problem that is an emergency. Do not wait to see if the symptoms will go away. Get medical help right away. Call your local emergency services (911 in the [place] Do not drive yourself to the hospital. Summary Chlorhexidine gluconate (CHG) is a germ-killing (antiseptic) solution that is used to clean the skin. Cleaning your skin with CHG may help to lower your risk for infection. You may be given CHG to use for bathing. It may be in a bottle or in a prepackaged cloth to use on your skin. Carefully follow your health care provider's instructions and the instructions on the product label. Do not use CHG if you have a chlorhexidine allergy. Contact your health care provider if your skin gets irritated after scrubbing. This information is not intended to replace advice given to you by your health care provider. Make sure you discuss any questions you have with your health care provider. Document Revised: 02/19/2021 Document Reviewed: 02/19/2021 Elsevier Patient Education  2023 [employer].

## 2022-07-02 ENCOUNTER — Encounter (HOSPITAL_COMMUNITY)
Admission: RE | Admit: 2022-07-02 | Discharge: 2022-07-02 | Disposition: A | Discharge status: Home / Self Care | Payer: BC Managed Care – PPO | Source: Ambulatory Visit | Attending: Surgery | Admitting: Surgery

## 2022-07-02 ENCOUNTER — Encounter (HOSPITAL_COMMUNITY): Payer: Self-pay

## 2022-07-02 VITALS — BP 131/88 | HR 69 | Temp 97.7°F | Resp 18 | Ht 68.0 in | Wt 175.0 lb

## 2022-07-02 DIAGNOSIS — K4090 Unilateral inguinal hernia, without obstruction or gangrene, not specified as recurrent: Secondary | ICD-10-CM | POA: Diagnosis present

## 2022-07-02 DIAGNOSIS — Z01810 Encounter for preprocedural cardiovascular examination: Secondary | ICD-10-CM | POA: Insufficient documentation

## 2022-07-02 DIAGNOSIS — I10 Essential (primary) hypertension: Secondary | ICD-10-CM | POA: Insufficient documentation

## 2022-07-02 DIAGNOSIS — E785 Hyperlipidemia, unspecified: Secondary | ICD-10-CM | POA: Diagnosis not present

## 2022-07-02 HISTORY — DX: Nausea with vomiting, unspecified: R11.2

## 2022-07-02 HISTORY — DX: Other specified postprocedural states: Z98.890

## 2022-07-05 ENCOUNTER — Ambulatory Visit (HOSPITAL_COMMUNITY)
Admission: RE | Admit: 2022-07-05 | Discharge: 2022-07-05 | Disposition: A | Discharge status: Home / Self Care | Payer: BC Managed Care – PPO | Attending: Surgery | Admitting: Surgery

## 2022-07-05 ENCOUNTER — Ambulatory Visit (HOSPITAL_COMMUNITY): Payer: BC Managed Care – PPO | Admitting: Anesthesiology

## 2022-07-05 ENCOUNTER — Other Ambulatory Visit: Payer: Self-pay

## 2022-07-05 ENCOUNTER — Encounter (HOSPITAL_COMMUNITY)
Admission: RE | Disposition: A | Discharge status: Home / Self Care | Payer: Self-pay | Source: Home / Self Care | Attending: Surgery

## 2022-07-05 ENCOUNTER — Encounter (HOSPITAL_COMMUNITY): Payer: Self-pay | Admitting: Surgery

## 2022-07-05 DIAGNOSIS — K4090 Unilateral inguinal hernia, without obstruction or gangrene, not specified as recurrent: Secondary | ICD-10-CM

## 2022-07-05 DIAGNOSIS — I10 Essential (primary) hypertension: Secondary | ICD-10-CM | POA: Insufficient documentation

## 2022-07-05 DIAGNOSIS — E785 Hyperlipidemia, unspecified: Secondary | ICD-10-CM | POA: Insufficient documentation

## 2022-07-05 HISTORY — PX: INGUINAL HERNIA REPAIR: SHX194

## 2022-07-05 SURGERY — REPAIR, HERNIA, INGUINAL, ADULT
Anesthesia: General | Site: Inguinal | Laterality: Right

## 2022-07-05 MED ORDER — FENTANYL CITRATE PF 50 MCG/ML IJ SOSY: 25.0000 ug | PREFILLED_SYRINGE | INTRAMUSCULAR | Status: DC | PRN

## 2022-07-05 MED ORDER — FENTANYL CITRATE (PF) 100 MCG/2ML IJ SOLN
INTRAMUSCULAR | Status: DC | PRN
  Administered 2022-07-05 (×5): 50 ug via INTRAVENOUS

## 2022-07-05 MED ORDER — DEXAMETHASONE SODIUM PHOSPHATE 10 MG/ML IJ SOLN
INTRAMUSCULAR | Status: DC | PRN
  Administered 2022-07-05: 10 mg via INTRAVENOUS

## 2022-07-05 MED ORDER — OXYCODONE HCL 5 MG PO TABS: 5.0000 mg | ORAL_TABLET | Freq: Four times a day (QID) | ORAL | 0 refills | Status: AC | PRN

## 2022-07-05 MED ORDER — CHLORHEXIDINE GLUCONATE 0.12 % MT SOLN: 15.0000 mL | Freq: Once | OROMUCOSAL | Status: AC

## 2022-07-05 MED ORDER — LIDOCAINE HCL (PF) 2 % IJ SOLN
INTRAMUSCULAR | Status: AC
  Filled 2022-07-05: qty 5

## 2022-07-05 MED ORDER — DEXAMETHASONE SODIUM PHOSPHATE 10 MG/ML IJ SOLN
INTRAMUSCULAR | Status: AC
  Filled 2022-07-05: qty 1

## 2022-07-05 MED ORDER — BUPIVACAINE HCL (300 MG DOSE) 3 X 100 MG IL IMPL
DRUG_IMPLANT | Status: AC
  Filled 2022-07-05: qty 100

## 2022-07-05 MED ORDER — CHLORHEXIDINE GLUCONATE CLOTH 2 % EX PADS: 6.0000 | MEDICATED_PAD | Freq: Once | CUTANEOUS | Status: DC

## 2022-07-05 MED ORDER — PROPOFOL 10 MG/ML IV BOLUS
INTRAVENOUS | Status: DC | PRN
  Administered 2022-07-05: 180 mg via INTRAVENOUS

## 2022-07-05 MED ORDER — ROCURONIUM BROMIDE 10 MG/ML (PF) SYRINGE
PREFILLED_SYRINGE | INTRAVENOUS | Status: AC
  Filled 2022-07-05: qty 10

## 2022-07-05 MED ORDER — SODIUM CHLORIDE 0.9 % IR SOLN
Status: DC | PRN
  Administered 2022-07-05: 1000 mL

## 2022-07-05 MED ORDER — PROPOFOL 10 MG/ML IV BOLUS
INTRAVENOUS | Status: AC
  Filled 2022-07-05: qty 20

## 2022-07-05 MED ORDER — HYDROCODONE-ACETAMINOPHEN 7.5-325 MG PO TABS: 1.0000 | ORAL_TABLET | Freq: Once | ORAL | Status: DC | PRN

## 2022-07-05 MED ORDER — ACETAMINOPHEN 500 MG PO TABS: 1000.0000 mg | ORAL_TABLET | Freq: Four times a day (QID) | ORAL | 0 refills | Status: AC

## 2022-07-05 MED ORDER — CEFAZOLIN SODIUM-DEXTROSE 2-4 GM/100ML-% IV SOLN
2.0000 g | INTRAVENOUS | Status: AC
  Administered 2022-07-05: 2 g via INTRAVENOUS

## 2022-07-05 MED ORDER — PHENYLEPHRINE 80 MCG/ML (10ML) SYRINGE FOR IV PUSH (FOR BLOOD PRESSURE SUPPORT)
PREFILLED_SYRINGE | INTRAVENOUS | Status: AC
  Filled 2022-07-05: qty 10

## 2022-07-05 MED ORDER — ORAL CARE MOUTH RINSE: 15.0000 mL | Freq: Once | OROMUCOSAL | Status: AC

## 2022-07-05 MED ORDER — EPHEDRINE SULFATE (PRESSORS) 50 MG/ML IJ SOLN
INTRAMUSCULAR | Status: DC | PRN
  Administered 2022-07-05: 10 mg via INTRAVENOUS

## 2022-07-05 MED ORDER — CEFAZOLIN SODIUM-DEXTROSE 2-4 GM/100ML-% IV SOLN
INTRAVENOUS | Status: AC
  Filled 2022-07-05: qty 100

## 2022-07-05 MED ORDER — MIDAZOLAM HCL 5 MG/5ML IJ SOLN
INTRAMUSCULAR | Status: DC | PRN
  Administered 2022-07-05: 2 mg via INTRAVENOUS

## 2022-07-05 MED ORDER — MIDAZOLAM HCL 2 MG/2ML IJ SOLN
INTRAMUSCULAR | Status: AC
  Filled 2022-07-05: qty 2

## 2022-07-05 MED ORDER — CHLORHEXIDINE GLUCONATE CLOTH 2 % EX PADS
6.0000 | MEDICATED_PAD | Freq: Once | CUTANEOUS | Status: AC
Start: 2022-07-05 — End: 2022-07-05
  Administered 2022-07-05: 6 via TOPICAL

## 2022-07-05 MED ORDER — LACTATED RINGERS IV SOLN
INTRAVENOUS | Status: DC
Start: 2022-07-05 — End: 2022-07-05

## 2022-07-05 MED ORDER — EPHEDRINE 5 MG/ML INJ
INTRAVENOUS | Status: AC
  Filled 2022-07-05: qty 5

## 2022-07-05 MED ORDER — FENTANYL CITRATE (PF) 250 MCG/5ML IJ SOLN
INTRAMUSCULAR | Status: AC
  Filled 2022-07-05: qty 5

## 2022-07-05 MED ORDER — SEVOFLURANE IN SOLN
RESPIRATORY_TRACT | Status: AC
  Filled 2022-07-05: qty 250

## 2022-07-05 MED ORDER — ONDANSETRON HCL 4 MG/2ML IJ SOLN
INTRAMUSCULAR | Status: DC | PRN
  Administered 2022-07-05: 4 mg via INTRAVENOUS

## 2022-07-05 MED ORDER — BUPIVACAINE HCL (300 MG DOSE) 3 X 100 MG IL IMPL
DRUG_IMPLANT | Status: DC | PRN
  Administered 2022-07-05: 300 mg

## 2022-07-05 MED ORDER — SUGAMMADEX SODIUM 200 MG/2ML IV SOLN
INTRAVENOUS | Status: DC | PRN
  Administered 2022-07-05: 150 mg via INTRAVENOUS

## 2022-07-05 MED ORDER — ONDANSETRON HCL 4 MG/2ML IJ SOLN: 4.0000 mg | Freq: Once | INTRAMUSCULAR | Status: DC | PRN

## 2022-07-05 MED ORDER — DOCUSATE SODIUM 100 MG PO CAPS: 100.0000 mg | ORAL_CAPSULE | Freq: Two times a day (BID) | ORAL | 2 refills | Status: AC

## 2022-07-05 MED ORDER — ONDANSETRON HCL 4 MG/2ML IJ SOLN
INTRAMUSCULAR | Status: AC
  Filled 2022-07-05: qty 2

## 2022-07-05 MED ORDER — CHLORHEXIDINE GLUCONATE 0.12 % MT SOLN
OROMUCOSAL | Status: AC
  Administered 2022-07-05: 15 mL via OROMUCOSAL
  Filled 2022-07-05: qty 15

## 2022-07-05 MED ORDER — LIDOCAINE HCL (CARDIAC) PF 50 MG/5ML IV SOSY
PREFILLED_SYRINGE | INTRAVENOUS | Status: DC | PRN
  Administered 2022-07-05: 60 mg via INTRAVENOUS

## 2022-07-05 MED ORDER — ROCURONIUM 10MG/ML (10ML) SYRINGE FOR MEDFUSION PUMP - OPTIME
INTRAVENOUS | Status: DC | PRN
  Administered 2022-07-05: 50 mg via INTRAVENOUS

## 2022-07-05 SURGICAL SUPPLY — 36 items
ADH SKN CLS APL DERMABOND .7 (GAUZE/BANDAGES/DRESSINGS) ×1
CLOTH BEACON ORANGE TIMEOUT ST (SAFETY) ×3 IMPLANT
COVER LIGHT HANDLE STERIS (MISCELLANEOUS) ×6 IMPLANT
DERMABOND ADVANCED (GAUZE/BANDAGES/DRESSINGS) ×1
DERMABOND ADVANCED .7 DNX12 (GAUZE/BANDAGES/DRESSINGS) ×2 IMPLANT
DRAIN PENROSE 0.5X18 (DRAIN) ×3 IMPLANT
ELECT REM PT RETURN 9FT ADLT (ELECTROSURGICAL) ×2
ELECTRODE REM PT RTRN 9FT ADLT (ELECTROSURGICAL) ×2 IMPLANT
GAUZE SPONGE 4X4 12PLY STRL (GAUZE/BANDAGES/DRESSINGS) ×3 IMPLANT
GLOVE BIO SURGEON STRL SZ7 (GLOVE) ×1 IMPLANT
GLOVE BIOGEL PI IND STRL 6.5 (GLOVE) ×2 IMPLANT
GLOVE BIOGEL PI IND STRL 7.0 (GLOVE) ×4 IMPLANT
GLOVE BIOGEL PI INDICATOR 6.5 (GLOVE) ×1
GLOVE BIOGEL PI INDICATOR 7.0 (GLOVE) ×2
GLOVE SS BIOGEL STRL SZ 6.5 (GLOVE) IMPLANT
GLOVE SUPERSENSE BIOGEL SZ 6.5 (GLOVE) ×1
GLOVE SURG SS PI 6.5 STRL IVOR (GLOVE) ×6 IMPLANT
GOWN STRL REUS W/TWL LRG LVL3 (GOWN DISPOSABLE) ×9 IMPLANT
INST SET MINOR GENERAL (KITS) ×3 IMPLANT
KIT TURNOVER KIT A (KITS) ×3 IMPLANT
MANIFOLD NEPTUNE II (INSTRUMENTS) ×3 IMPLANT
MESH PRE-SHAPE KEYHOLE 1.8X4 (Mesh General) ×1 IMPLANT
NS IRRIG 1000ML POUR BTL (IV SOLUTION) ×3 IMPLANT
PACK MINOR (CUSTOM PROCEDURE TRAY) ×3 IMPLANT
PAD ARMBOARD 7.5X6 YLW CONV (MISCELLANEOUS) ×3 IMPLANT
SET BASIN LINEN APH (SET/KITS/TRAYS/PACK) ×3 IMPLANT
SOL PREP PROV IODINE SCRUB 4OZ (MISCELLANEOUS) ×3 IMPLANT
SUT MNCRL AB 4-0 PS2 18 (SUTURE) ×3 IMPLANT
SUT NOVA NAB GS-22 2 2-0 T-19 (SUTURE) ×3 IMPLANT
SUT SILK 0 FSL (SUTURE) ×1 IMPLANT
SUT SILK 2 0 FSL 18 (SUTURE) ×3 IMPLANT
SUT VIC AB 2-0 CT1 27 (SUTURE) ×2
SUT VIC AB 2-0 CT1 TAPERPNT 27 (SUTURE) ×2 IMPLANT
SUT VIC AB 3-0 SH 27 (SUTURE) ×4
SUT VIC AB 3-0 SH 27X BRD (SUTURE) ×2 IMPLANT
SUT VICRYL AB 3 0 TIES (SUTURE) ×1 IMPLANT

## 2022-07-05 NOTE — Progress Notes (Signed)
Update Note:  Spoke with the patient's wife and son in the consultation room.  Explained that he tolerated the procedure well, and we were able to repair his right inguinal hernia repair I did place a mesh to aid with the repair.  He will be discharged home with a prescription for narcotic pain medication, which she should take as needed for pain.  If he is taking the narcotic pain medication, he should take a stool softener to prevent constipation.  He may also use ice for swelling and pain in his right groin.  He should take scheduled Tylenol for the next 7 days.  He should not lift anything more than 10 pounds or do any type of significant abdominal exertion.  He will follow-up with me in 2 weeks.  All questions were answered to their expressed satisfaction.  Theophilus Kinds, DO [facility] [address] [place], [place] [address] [phone] (office)

## 2022-07-05 NOTE — Anesthesia Preprocedure Evaluation (Signed)
Anesthesia Evaluation  Patient identified by MRN, date of birth, ID band Patient awake    Reviewed: Allergy & Precautions, NPO status , Patient's Chart, lab work & pertinent test results  History of Anesthesia Complications (+) PONV and history of anesthetic complications  Airway Mallampati: II  TM Distance: >3 FB Neck ROM: Full    Dental  (+) Lower Dentures, Upper Dentures   Pulmonary neg pulmonary ROS,    Pulmonary exam normal        Cardiovascular hypertension, negative cardio ROS Normal cardiovascular exam     Neuro/Psych negative neurological ROS     GI/Hepatic Neg liver ROS, hiatal hernia, GERD  ,  Endo/Other  negative endocrine ROS  Renal/GU negative Renal ROS     Musculoskeletal negative musculoskeletal ROS (+)   Abdominal   Peds  Hematology negative hematology ROS (+)   Anesthesia Other Findings   Reproductive/Obstetrics                             Anesthesia Physical Anesthesia Plan  ASA: 2  Anesthesia Plan: General   Post-op Pain Management:    Induction: Intravenous  PONV Risk Score and Plan: 3 and Dexamethasone and Ondansetron  Airway Management Planned: Oral ETT  Additional Equipment:   Intra-op Plan:   Post-operative Plan: Extubation in OR  Informed Consent: I have reviewed the patients History and Physical, chart, labs and discussed the procedure including the risks, benefits and alternatives for the proposed anesthesia with the patient or authorized representative who has indicated his/her understanding and acceptance.     Dental advisory given  Plan Discussed with: CRNA  Anesthesia Plan Comments:         Anesthesia Quick Evaluation

## 2022-07-05 NOTE — Op Note (Signed)
[facility] Operative Note  07/05/22  Preoperative Diagnosis: Right inguinal hernia    Postoperative Diagnosis: Same   Procedure(s) Performed: Right inguinal hernia repair with mesh   Surgeon: Theophilus Kinds, DO    Assistants: Cecile Sheerer, RN   Anesthesia: General endotracheal   Anesthesiologist: Glynis Smiles, MD    Specimens: Hernia sac   Estimated Blood Loss: Minimal   Blood Replacement: None    Complications: None   Wound Class: Clean   Operative Indications: Patient is a 61 year old male who presents for open right inguinal hernia repair with mesh.  He has been having increasing right groin pain for the last 5 months, and he has noted a bulge.  He would like it repaired at this time to make activities less painful.  All risks, benefits, and alternatives to open right inguinal hernia repair with mesh were discussed with the patient, all of his questions were answered to his expressed satisfaction. The patient expresses he wishes to proceed, and informed consent was obtained.  Findings: Right inguinal hernia containing omentum   Procedure: The patient was taken to the operating room and placed supine. General endotracheal anesthesia was induced. Intravenous antibiotics were administered per protocol.  A time out was preformed verifying the correct patient, procedure, site, positioning and implants.  The right groin and scrotum were prepared and draped in the usual sterile fashion.   An incision was made in a natural skin crease between the pubic tubercle and the anterior superior iliac spine.  The incision was deepened with electrocautery through Scarpa's and Camper's fascia until the aponeurosis of the external oblique was encountered.  This was cleaned and the external ring was exposed.  An incision was made in the midportion of the external oblique aponeurosis in the direction of its fibers. The ilioinguinal nerve was not identified.  Flaps of  the external oblique were developed cephalad and inferiorly.    The cord was identified and it was gently dissented free at the pubic tubercle and encircled with a Penrose drain.  Attention was then directed at the anteromedial aspect of the cord, where an indirect hernia sac was identified.  The sac was carefully dissected free from the cord down to the level of the internal ring.  The vas and testicular vessels were identified and protected from harm.  Once the sac was dissected free from the cords, the Penrose was placed around the cord which was retracted inferiorly out of the field of view.  The hernia sac was opened, and the omentum was reduced into the internal ring without difficulty.  The hernia sac was then suture ligated with 0 silk.  Attention was then turned to the floor of the canal, which was grossly weakened without any defined defect or sac.  The floor weakness was brought together with a pursestring stitch.  The Bard keyhole mesh was sutured to the inguinal ligament inferiorly starting at the pubic tubercle using 2-0 Novafil interrupted sutures.  The mesh was sutured superiorly to the conjoint tendon using 2-0 Novafil interrupted sutures.  Care was taken to ensure the mesh was placed in a relaxed fashion to avoid excessive tension and no neurovascular structures were caught in the repair.  Laterally the tails of the mesh were crossed and the internal ring was recreated, allowing for passage of cords without tension.   Hemostasis was adequate.  The Penrose was removed.  The external oblique aponeurosis was closed with a 2-0 Vicryl suture in a running fashion, taking care to  not catch the ilioinguinal nerve in the suture line.  Scarpa's fashion was closed with a 3-0 Vicryl running stitch.  The dermis was closed with 3-0 Vicryl in an interrupted fashion.  The skin was closed with a subcuticular 4-0 Monocryl suture.  Dermabond was applied.   The testis was gently pulled down into its anatomic  position in the scrotum.  The patient tolerated the procedure well and was taken to the PACU in stable condition. All counts were correct at the end of the case.      Theophilus Kinds, DO  [facility] [address] [place], [place] [address] [phone] (office)

## 2022-07-05 NOTE — Anesthesia Procedure Notes (Signed)
Procedure Name: Intubation Date/Time: 07/05/2022 7:53 AM  Performed by: Ollen Bowl, CRNAPre-anesthesia Checklist: Patient identified, Patient being monitored, Timeout performed, Emergency Drugs available and Suction available Patient Re-evaluated:Patient Re-evaluated prior to induction Oxygen Delivery Method: Circle System Utilized Preoxygenation: Pre-oxygenation with 100% oxygen Induction Type: IV induction Ventilation: Mask ventilation without difficulty Laryngoscope Size: Mac and 3 Grade View: Grade II Tube type: Oral Tube size: 7.5 mm Number of attempts: 1 Airway Equipment and Method: stylet Placement Confirmation: ETT inserted through vocal cords under direct vision, positive ETCO2 and breath sounds checked- equal and bilateral Secured at: 22 cm Tube secured with: Tape Dental Injury: Teeth and Oropharynx as per pre-operative assessment

## 2022-07-05 NOTE — Anesthesia Postprocedure Evaluation (Signed)
Anesthesia Post Note  Patient: Brian Walters  Procedure(s) Performed: HERNIA REPAIR INGUINAL ADULT; OPEN; WITH MESH (Right: Inguinal)  Patient location during evaluation: PACU Anesthesia Type: General Level of consciousness: awake and alert Pain management: pain level controlled Vital Signs Assessment: post-procedure vital signs reviewed and stable Respiratory status: spontaneous breathing, nonlabored ventilation, respiratory function stable and patient connected to nasal cannula oxygen Cardiovascular status: blood pressure returned to baseline and stable Postop Assessment: no apparent nausea or vomiting Anesthetic complications: no   There were no known notable events for this encounter.   Last Vitals:  Vitals:   07/05/22 1013 07/05/22 1020  BP: (P) 115/80 108/63  Pulse: 87 83  Resp: 18 18  Temp:  36.5 C  SpO2: 92% 93%    Last Pain:  Vitals:   07/05/22 1020  TempSrc: Oral  PainSc: 4                  Glynis Smiles

## 2022-07-05 NOTE — Discharge Instructions (Signed)
Ambulatory Surgery Discharge Instructions  General Anesthesia or Sedation Do not drive or operate heavy machinery for 24 hours.  Do not consume alcohol, tranquilizers, sleeping medications, or any non-prescribed medications for 24 hours. Do not make important decisions or sign any important papers in the next 24 hours. You should have someone with you tonight at home.  Activity  You are advised to go directly home from the hospital.  Restrict your activities and rest for a day.  Resume light activity tomorrow. No heavy lifting over 10 lbs or strenuous exercise.  Fluids and Diet Begin with clear liquids, bouillon, dry toast, soda crackers.  If not nauseated, you may go to a regular diet when you desire.  Greasy and spicy foods are not advised.  Medications  If you have not had a bowel movement in 24 hours, take 2 tablespoons over the counter Milk of mag.             You May resume your blood thinners tomorrow (Aspirin, coumadin, or other).  You are being discharged with prescriptions for Opioid/Narcotic Medications: There are some specific considerations for these medications that you should know. Opioid Meds have risks & benefits. Addiction to these meds is always a concern with prolonged use Take medication only as directed Do not drive while taking narcotic pain medication Do not crush tablets or capsules Do not use a different container than medication was dispensed in Lock the container of medication in a cool, dry place out of reach of children and pets. Opioid medication can cause addiction Do not share with anyone else (this is a felony) Do not store medications for future use. Dispose of them properly.     Disposal:  Find a [facility] household drug take back site near you.  If you can't get to a drug take back site, use the recipe below as a last resort to dispose of expired, unused or unwanted drugs. Disposal  (Do not dispose chemotherapy drugs this way, talk to your  prescribing doctor instead.) Step 1: Mix drugs (do not crush) with dirt, kitty litter, or used coffee grounds and add a small amount of water to dissolve any solid medications. Step 2: Seal drugs in plastic bag. Step 3: Place plastic bag in trash. Step 4: Take prescription container and scratch out personal information, then recycle or throw away.  Operative Site  You have a liquid bandage over your incisions, this will begin to flake off in about a week. Ok to shower tomorrow. Keep wound clean and dry. No baths or swimming. No lifting more than 10 pounds.  Contact Information: If you have questions or concerns, please call our office, [phone], Monday- Thursday 8AM-5PM and Friday 8AM-12Noon.  If it is after hours or on the weekend, please call [facility], [phone], and ask to speak to the surgeon on call for Dr. Jailen Coward at [place].   SPECIFIC COMPLICATIONS TO WATCH FOR: Inability to urinate Fever over 101? F by mouth Nausea and vomiting lasting longer than 24 hours. Pain not relieved by medication ordered Swelling around the operative site Increased redness, warmth, hardness, around operative area Numbness, tingling, or cold fingers or toes Blood -soaked dressing, (small amounts of oozing may be normal) Increasing and progressive drainage from surgical area or exam site  

## 2022-07-05 NOTE — Interval H&P Note (Signed)
History and Physical Interval Note:  07/05/2022 7:20 AM  Brian Walters  has presented today for surgery, with the diagnosis of Right inguinal hernia.  The various methods of treatment have been discussed with the patient and family. After consideration of risks, benefits and other options for treatment, the patient has consented to  Procedure(s): HERNIA REPAIR INGUINAL ADULT; OPEN; WITH MESH (Right) as a surgical intervention.  The patient's history has been reviewed, patient examined, no change in status, stable for surgery.  I have reviewed the patient's chart and labs.  Questions were answered to the patient's satisfaction.     Phoenix Dresser A Phat Dalton

## 2022-07-05 NOTE — Transfer of Care (Signed)
Immediate Anesthesia Transfer of Care Note  Patient: XAINE SANSOM  Procedure(s) Performed: HERNIA REPAIR INGUINAL ADULT; OPEN; WITH MESH (Right: Inguinal)  Patient Location: PACU  Anesthesia Type:General  Level of Consciousness: sedated  Airway & Oxygen Therapy: Patient Spontanous Breathing and Patient connected to nasal cannula oxygen  Post-op Assessment: Report given to RN  Post vital signs: Reviewed and stable  Last Vitals:  Vitals Value Taken Time  BP 136/96 07/05/22 0945  Temp    Pulse 77 07/05/22 0947  Resp 9 07/05/22 0947  SpO2 98 % 07/05/22 0947  Vitals shown include unvalidated device data.  Last Pain:  Vitals:   07/05/22 0626  TempSrc: Oral  PainSc: 0-No pain         Complications: No notable events documented.

## 2022-07-08 ENCOUNTER — Encounter (HOSPITAL_COMMUNITY): Payer: Self-pay | Admitting: Surgery

## 2022-07-08 LAB — SURGICAL PATHOLOGY

## 2022-07-18 ENCOUNTER — Encounter: Payer: Self-pay | Admitting: Surgery

## 2022-07-18 ENCOUNTER — Ambulatory Visit (INDEPENDENT_AMBULATORY_CARE_PROVIDER_SITE_OTHER): Payer: BC Managed Care – PPO | Admitting: Surgery

## 2022-07-18 VITALS — BP 143/83 | HR 73 | Temp 97.5°F | Resp 16 | Ht 68.0 in | Wt 176.0 lb

## 2022-07-18 DIAGNOSIS — Z09 Encounter for follow-up examination after completed treatment for conditions other than malignant neoplasm: Secondary | ICD-10-CM

## 2022-07-18 NOTE — Progress Notes (Signed)
[facility] Note   HPI:  61 y.o. Male presents to clinic for post-op follow-up status post open right inguinal hernia repair with mesh on 7/14.  He has been doing well postoperatively.  He did note some increased swelling and pain the first few days after the surgery, but this is improving.  He is tolerating a diet without nausea and vomiting, and is moving his bowels without issue.  He denies fevers and chills.  Review of Systems:  All other review of systems: otherwise negative   Vital Signs:  BP (!) 143/83   Pulse 73   Temp (!) 97.5 F (36.4 C) (Oral)   Resp 16   Ht 5\' 8"  (1.727 m)   Wt 176 lb (79.8 kg)   SpO2 96%   BMI 26.76 kg/m    Physical Exam:  Physical Exam Vitals reviewed.  Constitutional:      Appearance: Normal appearance.  Abdominal:     Comments: Abdomen soft, nondistended, no percussion tenderness, nontender to palpation; no rigidity, guarding, rebound tenderness; right inguinal incision healing well with minimal underlying swelling, no erythema and minimal tenderness  Neurological:     Mental Status: He is alert.    Laboratory studies: None  Imaging:  None  Pathology: A.  RIGHT INGUINAL HERNIA, SAC, BIOPSY:  Benign hernia sac with focal reactive changes   Assessment:  61 y.o. yo Male who presents status post open right inguinal hernia repair with mesh on 7/14  Plan:  -Patient doing well postoperatively, tolerating a diet, moving his bowels, and adequate pain control. -I advised him that he may ice his groin as needed for swelling, especially on days where he has been more mobile -Continue with weight restrictions of no more than 10 pounds for an additional 2 weeks -Follow up as needed  All of the above recommendations were discussed with the patient, and all of patient's questions were answered to his expressed satisfaction.  8/14, DO [facility] [address] [place], [place]  [place] [phone] (office)

## 2022-10-14 ENCOUNTER — Other Ambulatory Visit: Payer: BC Managed Care – PPO

## 2022-10-14 DIAGNOSIS — Z0000 Encounter for general adult medical examination without abnormal findings: Secondary | ICD-10-CM

## 2022-10-14 DIAGNOSIS — I10 Essential (primary) hypertension: Secondary | ICD-10-CM

## 2022-10-15 LAB — CMP14+EGFR
ALT: 33 IU/L (ref 0–44)
AST: 23 IU/L (ref 0–40)
Albumin/Globulin Ratio: 1.8 (ref 1.2–2.2)
Albumin: 4.6 g/dL (ref 3.8–4.9)
Alkaline Phosphatase: 122 IU/L — ABNORMAL HIGH (ref 44–121)
BUN/Creatinine Ratio: 11 (ref 9–20)
BUN: 10 mg/dL (ref 6–24)
Bilirubin Total: 0.7 mg/dL (ref 0.0–1.2)
CO2: 24 mmol/L (ref 20–29)
Calcium: 9.8 mg/dL (ref 8.7–10.2)
Chloride: 101 mmol/L (ref 96–106)
Creatinine, Ser: 0.94 mg/dL (ref 0.76–1.27)
Globulin, Total: 2.6 g/dL (ref 1.5–4.5)
Glucose: 108 mg/dL — ABNORMAL HIGH (ref 70–99)
Potassium: 4.5 mmol/L (ref 3.5–5.2)
Sodium: 139 mmol/L (ref 134–144)
Total Protein: 7.2 g/dL (ref 6.0–8.5)
eGFR: 93 mL/min/{1.73_m2} (ref >59)

## 2022-10-15 LAB — CBC WITH DIFFERENTIAL/PLATELET
Basophils Absolute: 0.1 10*3/uL (ref 0.0–0.2)
Basos: 1 %
EOS (ABSOLUTE): 0.2 10*3/uL (ref 0.0–0.4)
Eos: 3 %
Hematocrit: 47.2 % (ref 37.5–51.0)
Hemoglobin: 16.6 g/dL (ref 13.0–17.7)
Immature Grans (Abs): 0 10*3/uL (ref 0.0–0.1)
Immature Granulocytes: 0 %
Lymphocytes Absolute: 1.8 10*3/uL (ref 0.7–3.1)
Lymphs: 27 %
MCH: 32 pg (ref 26.6–33.0)
MCHC: 35.2 g/dL (ref 31.5–35.7)
MCV: 91 fL (ref 79–97)
Monocytes Absolute: 0.6 10*3/uL (ref 0.1–0.9)
Monocytes: 9 %
Neutrophils Absolute: 4 10*3/uL (ref 1.4–7.0)
Neutrophils: 60 %
Platelets: 119 10*3/uL — ABNORMAL LOW (ref 150–450)
RBC: 5.18 x10E6/uL (ref 4.14–5.80)
RDW: 12.4 % (ref 11.6–15.4)
WBC: 6.8 10*3/uL (ref 3.4–10.8)

## 2022-10-15 LAB — LIPID PANEL
Chol/HDL Ratio: 3.9 ratio (ref 0.0–5.0)
Cholesterol, Total: 136 mg/dL (ref 100–199)
HDL: 35 mg/dL — ABNORMAL LOW (ref >39)
LDL Chol Calc (NIH): 81 mg/dL (ref 0–99)
Triglycerides: 107 mg/dL (ref 0–149)
VLDL Cholesterol Cal: 20 mg/dL (ref 5–40)

## 2022-10-15 LAB — PSA, TOTAL AND FREE
PSA, Free Pct: 21.9 %
PSA, Free: 0.35 ng/mL
Prostate Specific Ag, Serum: 1.6 ng/mL (ref 0.0–4.0)

## 2022-10-16 ENCOUNTER — Other Ambulatory Visit: Payer: Self-pay | Admitting: Family Medicine

## 2022-10-16 ENCOUNTER — Ambulatory Visit (INDEPENDENT_AMBULATORY_CARE_PROVIDER_SITE_OTHER): Payer: BC Managed Care – PPO | Admitting: Family Medicine

## 2022-10-16 ENCOUNTER — Encounter: Payer: Self-pay | Admitting: Family Medicine

## 2022-10-16 VITALS — BP 155/93 | HR 92 | Temp 97.6°F | Ht 68.0 in | Wt 176.0 lb

## 2022-10-16 DIAGNOSIS — Z23 Encounter for immunization: Secondary | ICD-10-CM | POA: Diagnosis not present

## 2022-10-16 DIAGNOSIS — Z0001 Encounter for general adult medical examination with abnormal findings: Secondary | ICD-10-CM | POA: Diagnosis not present

## 2022-10-16 DIAGNOSIS — I10 Essential (primary) hypertension: Secondary | ICD-10-CM

## 2022-10-16 DIAGNOSIS — Z0000 Encounter for general adult medical examination without abnormal findings: Secondary | ICD-10-CM

## 2022-10-16 DIAGNOSIS — E785 Hyperlipidemia, unspecified: Secondary | ICD-10-CM

## 2022-10-16 MED ORDER — PRAVASTATIN SODIUM 80 MG PO TABS: 80.0000 mg | ORAL_TABLET | Freq: Every day | ORAL | 3 refills | Status: DC

## 2022-10-16 MED ORDER — AMLODIPINE BESYLATE 5 MG PO TABS: 5.0000 mg | ORAL_TABLET | Freq: Two times a day (BID) | ORAL | 3 refills | Status: DC

## 2022-10-16 NOTE — Progress Notes (Signed)
BP (!) 155/93   Pulse 92   Temp 97.6 F (36.4 C)   Ht _0  (1.727 m)   Wt 176 lb (79.8 kg)   SpO2 95%   BMI 26.76 kg/m    Subjective:   Patient ID: Brian Walters, male    DOB: 13-Dec-1961, 61 y.o.   MRN: [id number]  HPI: Brian Walters is a 61 y.o. male presenting on 10/16/2022 for Medical Management of Chronic Issues (CPE)   HPI Physical exam Patient denies any chest pain, shortness of breath, headaches or vision issues, abdominal complaints, diarrhea, nausea, vomiting, or joint issues.   Hypertension Patient is currently on amlodipine and pravastatin, and their blood pressure today is 155/93 and 149/84, he says at home it runs in the 130s and 140s and then the 80s and 90s about 50% of the time. Patient denies any lightheadedness or dizziness. Patient denies headaches, blurred vision, chest pains, shortness of breath, or weakness. Denies any side effects from medication and is content with current medication.   Hyperlipidemia Patient is coming in for recheck of his hyperlipidemia. The patient is currently taking pravastatin. They deny any issues with myalgias or history of liver damage from it. They deny any focal numbness or weakness or chest pain.   Relevant past medical, surgical, family and social history reviewed and updated as indicated. Interim medical history since our last visit reviewed. Allergies and medications reviewed and updated.  Review of Systems  Constitutional:  Negative for chills and fever.  HENT:  Negative for ear pain and tinnitus.   Eyes:  Negative for pain.  Respiratory:  Negative for cough, shortness of breath and wheezing.   Cardiovascular:  Negative for chest pain, palpitations and leg swelling.  Gastrointestinal:  Negative for abdominal pain, blood in stool, constipation and diarrhea.  Genitourinary:  Negative for dysuria and hematuria.  Musculoskeletal:  Negative for back pain and myalgias.  Skin:  Negative for rash.  Neurological:   Negative for dizziness, weakness and headaches.  Psychiatric/Behavioral:  Negative for suicidal ideas.     Per HPI unless specifically indicated above   Allergies as of 10/16/2022   No Known Allergies      Medication List        Accurate as of October 16, 2022 10:06 AM. If you have any questions, ask your nurse or doctor.          amLODipine 5 MG tablet Commonly known as: NORVASC Take 1 tablet (5 mg total) by mouth 2 (two) times daily. Take 5 mg in the morning and 2.5 mg in the evening for a total of 7.5 mg in the day. What changed:  how much to take how to take this when to take this Changed by: Fransisca Kaufmann Chelsye Suhre, MD   docusate sodium 100 MG capsule Commonly known as: Colace Take 1 capsule (100 mg total) by mouth 2 (two) times daily. What changed:  when to take this reasons to take this   pravastatin 80 MG tablet Commonly known as: PRAVACHOL Take 1 tablet (80 mg total) by mouth daily.         Objective:   BP (!) 155/93   Pulse 92   Temp 97.6 F (36.4 C)   Ht _1  (1.727 m)   Wt 176 lb (79.8 kg)   SpO2 95%   BMI 26.76 kg/m   Wt Readings from Last 3 Encounters:  10/16/22 176 lb (79.8 kg)  07/18/22 176 lb (79.8 kg)  07/05/22 174 lb (  78.9 kg)    Physical Exam Vitals and nursing note reviewed.  Constitutional:      General: He is not in acute distress.    Appearance: Normal appearance. He is well-developed. He is not diaphoretic.  HENT:     Right Ear: External ear normal.     Left Ear: External ear normal.     Nose: Nose normal.     Mouth/Throat:     Pharynx: No oropharyngeal exudate.  Eyes:     General: No scleral icterus.       Right eye: No discharge.     Conjunctiva/sclera: Conjunctivae normal.     Pupils: Pupils are equal, round, and reactive to light.  Neck:     Thyroid: No thyromegaly.  Cardiovascular:     Rate and Rhythm: Normal rate and regular rhythm.     Heart sounds: Normal heart sounds. No murmur heard. Pulmonary:      Effort: Pulmonary effort is normal. No respiratory distress.     Breath sounds: Normal breath sounds. No wheezing.  Abdominal:     General: Bowel sounds are normal. There is no distension.     Palpations: Abdomen is soft.     Tenderness: There is no abdominal tenderness. There is no guarding or rebound.  Musculoskeletal:        General: Normal range of motion.     Cervical back: Neck supple.  Lymphadenopathy:     Cervical: No cervical adenopathy.  Skin:    General: Skin is warm and dry.     Findings: No rash.  Neurological:     Mental Status: He is alert and oriented to person, place, and time.     Coordination: Coordination normal.  Psychiatric:        Behavior: Behavior normal.     Results for orders placed or performed in visit on 10/14/22  Lipid panel  Result Value Ref Range   Cholesterol, Total 136 100 - 199 mg/dL   Triglycerides 107 0 - 149 mg/dL   HDL 35 (L) >39 mg/dL   VLDL Cholesterol Cal 20 5 - 40 mg/dL   LDL Chol Calc (NIH) 81 0 - 99 mg/dL   Chol/HDL Ratio 3.9 0.0 - 5.0 ratio  CBC with Differential/Platelet  Result Value Ref Range   WBC 6.8 3.4 - 10.8 x10E3/uL   RBC 5.18 4.14 - 5.80 x10E6/uL   Hemoglobin 16.6 13.0 - 17.7 g/dL   Hematocrit 47.2 37.5 - 51.0 %   MCV 91 79 - 97 fL   MCH 32.0 26.6 - 33.0 pg   MCHC 35.2 31.5 - 35.7 g/dL   RDW 12.4 11.6 - 15.4 %   Platelets 119 (L) 150 - 450 x10E3/uL   Neutrophils 60 Not Estab. %   Lymphs 27 Not Estab. %   Monocytes 9 Not Estab. %   Eos 3 Not Estab. %   Basos 1 Not Estab. %   Neutrophils Absolute 4.0 1.4 - 7.0 x10E3/uL   Lymphocytes Absolute 1.8 0.7 - 3.1 x10E3/uL   Monocytes Absolute 0.6 0.1 - 0.9 x10E3/uL   EOS (ABSOLUTE) 0.2 0.0 - 0.4 x10E3/uL   Basophils Absolute 0.1 0.0 - 0.2 x10E3/uL   Immature Granulocytes 0 Not Estab. %   Immature Grans (Abs) 0.0 0.0 - 0.1 x10E3/uL  CMP14+EGFR  Result Value Ref Range   Glucose 108 (H) 70 - 99 mg/dL   BUN 10 6 - 24 mg/dL   Creatinine, Ser 0.94 0.76 - 1.27 mg/dL    eGFR 93 >59  mL/min/1.73   BUN/Creatinine Ratio 11 9 - 20   Sodium 139 134 - 144 mmol/L   Potassium 4.5 3.5 - 5.2 mmol/L   Chloride 101 96 - 106 mmol/L   CO2 24 20 - 29 mmol/L   Calcium 9.8 8.7 - 10.2 mg/dL   Total Protein 7.2 6.0 - 8.5 g/dL   Albumin 4.6 3.8 - 4.9 g/dL   Globulin, Total 2.6 1.5 - 4.5 g/dL   Albumin/Globulin Ratio 1.8 1.2 - 2.2   Bilirubin Total 0.7 0.0 - 1.2 mg/dL   Alkaline Phosphatase 122 (H) 44 - 121 IU/L   AST 23 0 - 40 IU/L   ALT 33 0 - 44 IU/L  PSA, total and free  Result Value Ref Range   Prostate Specific Ag, Serum 1.6 0.0 - 4.0 ng/mL   PSA, Free 0.35 N/A ng/mL   PSA, Free Pct 21.9 %    Assessment & Plan:   Problem List Items Addressed This Visit       Cardiovascular and Mediastinum   Essential hypertension (Chronic)   Relevant Medications   amLODipine (NORVASC) 5 MG tablet   pravastatin (PRAVACHOL) 80 MG tablet     Other   Hyperlipidemia with target LDL less than 100 (Chronic)   Relevant Medications   amLODipine (NORVASC) 5 MG tablet   pravastatin (PRAVACHOL) 80 MG tablet   Other Visit Diagnoses     Physical exam    -  Primary     Blood sugar borderline otherwise blood work looks pretty decent.  No changes to cholesterol medicine, that looked good.  Blood pressure seems like it is running borderline elevated consistently, will try amlodipine 5 mg twice daily which is an increase from his previous.  Follow up plan: Return in about 1 year (around 10/17/2023), or if symptoms worsen or fail to improve, for Physical exam.  Counseling provided for all of the vaccine components No orders of the defined types were placed in this encounter.   Caryl Pina, MD [facility] 10/16/2022, 10:06 AM

## 2022-10-16 NOTE — Telephone Encounter (Signed)
Sent a new prescription that is clarified, should be taking 5 mg twice daily.

## 2022-10-16 NOTE — Telephone Encounter (Signed)
Name from pharmacy: AMLODIPINE BESYLATE 5 MG TAB        Will file in chart as: amLODipine (NORVASC) 5 MG tablet    Possible duplicate: Hover to review recent actions on this medication   Sig: TAKE 1 TABLET BY MOUTH 2X DAILY. TAKE 5 MG IN THE MORNING AND 2.5 MG IN THE EVENING FOR A TOTAL OF 7.5 MG IN THE DAY.   Pharmacy comment: Alternative Requested:PLAN PAYS 1 PER DAY OR USE A HIGHER STRENGTH.

## 2022-11-18 ENCOUNTER — Ambulatory Visit (INDEPENDENT_AMBULATORY_CARE_PROVIDER_SITE_OTHER): Payer: BC Managed Care – PPO | Admitting: Family Medicine

## 2022-11-18 ENCOUNTER — Encounter: Payer: Self-pay | Admitting: Family Medicine

## 2022-11-18 DIAGNOSIS — U071 COVID-19: Secondary | ICD-10-CM | POA: Diagnosis not present

## 2022-11-18 MED ORDER — NIRMATRELVIR/RITONAVIR (PAXLOVID)TABLET: 3.0000 | ORAL_TABLET | Freq: Two times a day (BID) | ORAL | 0 refills | Status: AC

## 2022-11-18 NOTE — Progress Notes (Signed)
Virtual Visit via telephone Note  I connected with Brian Walters on 11/18/22 at 1109 by telephone and verified that I am speaking with the correct person using two identifiers. Brian Walters is currently located at home and patient are currently with her during visit. The provider, Elige Radon Khaleem Burchill, MD is located in their office at time of visit.  Call ended at 1114  I discussed the limitations, risks, security and privacy concerns of performing an evaluation and management service by telephone and the availability of in person appointments. I also discussed with the patient that there may be a patient responsible charge related to this service. The patient expressed understanding and agreed to proceed.   History and Present Illness: Patinet is calling in for congestion and headache 2 days ago.  He tested positive for covid yesterday and has sore throat.  He has chills but no fever or aches.  He denies SOB or wheezing.  He has a productive cough.  He family over for thanksgiving. His borther had covid. He received 3 covid vaccines.   1. COVID-19 virus infection     Outpatient Encounter Medications as of 11/18/2022  Medication Sig   nirmatrelvir/ritonavir EUA (PAXLOVID) 20 x 150 MG & 10 x 100MG  TABS Take 3 tablets by mouth 2 (two) times daily for 5 days. (Take nirmatrelvir 150 mg two tablets twice daily for 5 days and ritonavir 100 mg one tablet twice daily for 5 days) Patient GFR is 86   amLODipine (NORVASC) 5 MG tablet Take 1 tablet (5 mg total) by mouth 2 (two) times daily.   docusate sodium (COLACE) 100 MG capsule Take 1 capsule (100 mg total) by mouth 2 (two) times daily. (Patient taking differently: Take 100 mg by mouth daily as needed for mild constipation.)   pravastatin (PRAVACHOL) 80 MG tablet Take 1 tablet (80 mg total) by mouth daily.   No facility-administered encounter medications on file as of 11/18/2022.    Review of Systems  Constitutional:  Positive for chills.  Negative for fever.  HENT:  Positive for congestion, postnasal drip, rhinorrhea, sinus pressure and sore throat. Negative for ear discharge, ear pain, sneezing and voice change.   Eyes:  Negative for pain, discharge, redness and visual disturbance.  Respiratory:  Positive for cough. Negative for shortness of breath and wheezing.   Cardiovascular:  Negative for chest pain and leg swelling.  Musculoskeletal:  Negative for gait problem.  Skin:  Negative for rash.  Neurological:  Positive for headaches.  All other systems reviewed and are negative.   Observations/Objective: Patient sounds comfortable and   Assessment and Plan: Problem List Items Addressed This Visit   None Visit Diagnoses     COVID-19 virus infection    -  Primary   Relevant Medications   nirmatrelvir/ritonavir EUA (PAXLOVID) 20 x 150 MG & 10 x 100MG  TABS       Sent covid medication Mucinex  Flonase tylenol Follow up plan: Return if symptoms worsen or fail to improve.     I discussed the assessment and treatment plan with the patient. The patient was provided an opportunity to ask questions and all were answered. The patient agreed with the plan and demonstrated an understanding of the instructions.   The patient was advised to call back or seek an in-person evaluation if the symptoms worsen or if the condition fails to improve as anticipated.  The above assessment and management plan was discussed with the patient. The patient verbalized understanding of  and has agreed to the management plan. Patient is aware to call the clinic if symptoms persist or worsen. Patient is aware when to return to the clinic for a follow-up visit. Patient educated on when it is appropriate to go to the emergency department.    I provided 5 minutes of non-face-to-face time during this encounter.    Worthy Rancher, MD

## 2023-09-22 ENCOUNTER — Ambulatory Visit: Payer: BC Managed Care – PPO | Admitting: Family Medicine

## 2023-09-22 ENCOUNTER — Encounter: Payer: Self-pay | Admitting: Family Medicine

## 2023-09-22 VITALS — BP 138/84 | HR 92 | Temp 98.1°F | Resp 20 | Ht 68.0 in | Wt 173.5 lb

## 2023-09-22 DIAGNOSIS — S39012A Strain of muscle, fascia and tendon of lower back, initial encounter: Secondary | ICD-10-CM

## 2023-09-22 DIAGNOSIS — J069 Acute upper respiratory infection, unspecified: Secondary | ICD-10-CM | POA: Diagnosis not present

## 2023-09-22 MED ORDER — BACLOFEN 10 MG PO TABS: 10.0000 mg | ORAL_TABLET | Freq: Three times a day (TID) | ORAL | 0 refills | Status: DC

## 2023-09-22 NOTE — Progress Notes (Signed)
BP 138/84   Pulse 92   Temp 98.1 F (36.7 C) (Oral)   Resp 20   Ht 5\' 8"  (1.727 m)   Wt 173 lb 8 oz (78.7 kg)   SpO2 95%   BMI 26.38 kg/m    Subjective:   Patient ID: Brian Walters, male    DOB: 09/13/1961, 62 y.o.   MRN: [id number]  HPI: Brian Walters is a 62 y.o. male presenting on 09/22/2023 for No chief complaint on file.   HPI Cough and congestion and drainage Patient has been having cough and congestion.  This has been going on for the past 4 days.  He did a home COVID test that was negative.  He says his wife did have a head cold last week and he thinks that is where he got it from.  He denies any shortness of breath or wheezing.  Patient denies any fevers or chills or shortness of breath or wheezing.  Patient has not used anything over-the-counter to help with that at this point.  His wife sentiment to get it checked out.  Back pain Patient has left-sided mid back pain that is been bothering him for about 2 weeks.  He says he was pushing on a ladder and felt something move or give way and then his left mid back and since then he has been having trouble with it.  He says laying down makes it feel better in a certain position and stretching helps some but he has not taken any Tylenol or ibuprofen or anything over-the-counter at this point.  He denies any numbness or weakness or shooting anywhere down his legs.  He denies any urinary or bowel issues or shortness of breath or wheezing.  Relevant past medical, surgical, family and social history reviewed and updated as indicated. Interim medical history since our last visit reviewed. Allergies and medications reviewed and updated.  Review of Systems  Constitutional:  Negative for chills and fever.  HENT:  Positive for congestion, postnasal drip and sore throat. Negative for ear discharge, ear pain, rhinorrhea, sinus pressure, sneezing and voice change.   Eyes:  Negative for pain, discharge, redness and visual disturbance.   Respiratory:  Positive for cough. Negative for shortness of breath and wheezing.   Cardiovascular:  Negative for chest pain and leg swelling.  Musculoskeletal:  Positive for back pain. Negative for gait problem.  Skin:  Negative for rash.  All other systems reviewed and are negative.   Per HPI unless specifically indicated above   Allergies as of 09/22/2023   No Known Allergies      Medication List        Accurate as of September 22, 2023  1:47 PM. If you have any questions, ask your nurse or doctor.          amLODipine 5 MG tablet Commonly known as: NORVASC Take 1 tablet (5 mg total) by mouth 2 (two) times daily.   baclofen 10 MG tablet Commonly known as: LIORESAL Take 1 tablet (10 mg total) by mouth 3 (three) times daily. Started by: Elige Radon Marisal Swarey   pravastatin 80 MG tablet Commonly known as: PRAVACHOL Take 1 tablet (80 mg total) by mouth daily.         Objective:   BP 138/84   Pulse 92   Temp 98.1 F (36.7 C) (Oral)   Resp 20   Ht 5\' 8"  (1.727 m)   Wt 173 lb 8 oz (78.7 kg)   SpO2 95%  BMI 26.38 kg/m   Wt Readings from Last 3 Encounters:  09/22/23 173 lb 8 oz (78.7 kg)  10/16/22 176 lb (79.8 kg)  07/18/22 176 lb (79.8 kg)    Physical Exam Vitals and nursing note reviewed.  Constitutional:      General: He is not in acute distress.    Appearance: He is well-developed. He is not diaphoretic.  HENT:     Right Ear: Tympanic membrane, ear canal and external ear normal.     Left Ear: Tympanic membrane, ear canal and external ear normal.     Nose: Mucosal edema present. No rhinorrhea.     Right Sinus: Maxillary sinus tenderness present. No frontal sinus tenderness.     Left Sinus: Maxillary sinus tenderness present. No frontal sinus tenderness.     Mouth/Throat:     Pharynx: Uvula midline. No oropharyngeal exudate or posterior oropharyngeal erythema.     Tonsils: No tonsillar abscesses.  Eyes:     General: No scleral icterus.       Right  eye: No discharge.     Conjunctiva/sclera: Conjunctivae normal.     Pupils: Pupils are equal, round, and reactive to light.  Neck:     Thyroid: No thyromegaly.  Cardiovascular:     Rate and Rhythm: Normal rate and regular rhythm.     Heart sounds: Normal heart sounds. No murmur heard. Pulmonary:     Effort: Pulmonary effort is normal. No respiratory distress.     Breath sounds: Normal breath sounds. No wheezing, rhonchi or rales.  Musculoskeletal:        General: Normal range of motion.     Cervical back: Neck supple. No tenderness or bony tenderness.     Thoracic back: No swelling, deformity, tenderness or bony tenderness. No scoliosis.       Back:  Lymphadenopathy:     Cervical: No cervical adenopathy.  Skin:    General: Skin is warm and dry.     Findings: No rash.  Neurological:     Mental Status: He is alert and oriented to person, place, and time.     Coordination: Coordination normal.  Psychiatric:        Behavior: Behavior normal.       Assessment & Plan:   Problem List Items Addressed This Visit   None Visit Diagnoses     Viral URI    -  Primary   Back strain, initial encounter       Relevant Medications   baclofen (LIORESAL) 10 MG tablet       Recommended Flonase and Mucinex to help with congestion and can also use Tylenol and ibuprofen.  Sent in muscle relaxer to help with his back and recommended heating pad Follow up plan: Return if symptoms worsen or fail to improve.  Counseling provided for all of the vaccine components No orders of the defined types were placed in this encounter.   Arville Care, MD [facility] 09/22/2023, 1:47 PM

## 2023-10-20 ENCOUNTER — Encounter: Payer: Self-pay | Admitting: Family Medicine

## 2023-10-20 ENCOUNTER — Ambulatory Visit (INDEPENDENT_AMBULATORY_CARE_PROVIDER_SITE_OTHER): Payer: BC Managed Care – PPO | Admitting: Family Medicine

## 2023-10-20 ENCOUNTER — Telehealth: Payer: Self-pay | Admitting: Family Medicine

## 2023-10-20 VITALS — BP 133/77 | HR 75 | Ht 68.0 in | Wt 175.0 lb

## 2023-10-20 DIAGNOSIS — I10 Essential (primary) hypertension: Secondary | ICD-10-CM

## 2023-10-20 DIAGNOSIS — E785 Hyperlipidemia, unspecified: Secondary | ICD-10-CM | POA: Diagnosis not present

## 2023-10-20 DIAGNOSIS — K2100 Gastro-esophageal reflux disease with esophagitis, without bleeding: Secondary | ICD-10-CM

## 2023-10-20 DIAGNOSIS — Z0001 Encounter for general adult medical examination with abnormal findings: Secondary | ICD-10-CM

## 2023-10-20 DIAGNOSIS — N401 Enlarged prostate with lower urinary tract symptoms: Secondary | ICD-10-CM | POA: Diagnosis not present

## 2023-10-20 DIAGNOSIS — Z23 Encounter for immunization: Secondary | ICD-10-CM

## 2023-10-20 DIAGNOSIS — K649 Unspecified hemorrhoids: Secondary | ICD-10-CM

## 2023-10-20 DIAGNOSIS — R351 Nocturia: Secondary | ICD-10-CM

## 2023-10-20 DIAGNOSIS — H539 Unspecified visual disturbance: Secondary | ICD-10-CM

## 2023-10-20 DIAGNOSIS — Z0000 Encounter for general adult medical examination without abnormal findings: Secondary | ICD-10-CM

## 2023-10-20 MED ORDER — AMLODIPINE BESYLATE 5 MG PO TABS: 5.0000 mg | ORAL_TABLET | Freq: Two times a day (BID) | ORAL | 3 refills | Status: DC

## 2023-10-20 MED ORDER — PRAVASTATIN SODIUM 80 MG PO TABS: 80.0000 mg | ORAL_TABLET | Freq: Every day | ORAL | 3 refills | Status: DC

## 2023-10-20 NOTE — Telephone Encounter (Signed)
Labs ordered.

## 2023-10-20 NOTE — Progress Notes (Unsigned)
BP 133/77   Pulse 75   Ht 5\' 8"  (1.727 m)   Wt 175 lb (79.4 kg)   SpO2 97%   BMI 26.61 kg/m    Subjective:   Patient ID: Brian Walters, male    DOB: [birth year], 62 y.o.   MRN: [id number]  HPI: Brian Walters is a 62 y.o. male presenting on 10/20/2023 for Medical Management of Chronic Issues (CPE)  Patient is here today for annual physical and management of chronic issues.  Hypertension Patient is currently taking amlodipine 5 mg daily. His blood pressure is 133/77 today. His blood pressure runs in the 120-130s systolic at home. He reports lightheadedness upon standing from lying flat about once per month. He denies any hypotensive readings on his blood pressure monitor. He denies headaches, shortness of breath, chest pain, or LE edema.  Hyperlipidemia Patient is currently taking pravastatin 80 mg daily. He denies myalgias or weakness. He does not have a history of liver damage from it.  Patient also reports cloudy vision that worsens at night with light glaring/reflection. He has not seen an ophthalmologist in over a year.  Patient reports acid reflux for which he takes Prilosec about once every 2 weeks. He has tried lifestyle modifications by decreasing certain foods and eating several hours before lying down. He still experiences hoarseness every now and then from the acid reflux.  Patient reports hemorrhoids that flare up a few times a month. He will notice bright red blood when wiping after using the restroom in addition to anal pruritus. He denies any blood in the stool. He does not currently use any medications to help with these symptoms.  Patient reports nocturia about 3 times per night. The nocturia is not currently bothersome as he continues to sleep well throughout the night. He does not want to start any medications at this time.   Relevant past medical, surgical, family and social history reviewed and updated as indicated. Interim medical history since our last  visit reviewed. Allergies and medications reviewed and updated.   Review of Systems  Constitutional:  Negative for appetite change and fatigue.  HENT:  Positive for voice change. Negative for hearing loss, sore throat and trouble swallowing.        Periodic hoarseness from acid reflux  Eyes:  Positive for visual disturbance.       Patient reports "cloudy" vision and glaring lights at night  Respiratory:  Negative for chest tightness, shortness of breath and wheezing.   Cardiovascular:  Negative for chest pain, palpitations and leg swelling.  Gastrointestinal:  Positive for anal bleeding. Negative for abdominal pain, blood in stool, constipation and diarrhea.       Hemorrhoids  Genitourinary:  Positive for frequency. Negative for difficulty urinating, dysuria and hematuria.       Nocturia about 3 times per night  Musculoskeletal:  Negative for arthralgias and myalgias.  Neurological:  Positive for light-headedness. Negative for syncope, weakness and headaches.       Lightheadedness upon standing about once per month    Per HPI unless specifically indicated above   Allergies as of 10/20/2023   No Known Allergies      Medication List        Accurate as of October 20, 2023 10:23 AM. If you have any questions, ask your nurse or doctor.          STOP taking these medications    baclofen 10 MG tablet Commonly known as: LIORESAL Stopped by: Elige Radon  Dettinger       TAKE these medications    amLODipine 5 MG tablet Commonly known as: NORVASC Take 1 tablet (5 mg total) by mouth 2 (two) times daily.   pravastatin 80 MG tablet Commonly known as: PRAVACHOL Take 1 tablet (80 mg total) by mouth daily.         Objective:   BP 133/77   Pulse 75   Ht 5\' 8"  (1.727 m)   Wt 175 lb (79.4 kg)   SpO2 97%   BMI 26.61 kg/m   Wt Readings from Last 3 Encounters:  10/20/23 175 lb (79.4 kg)  09/22/23 173 lb 8 oz (78.7 kg)  10/16/22 176 lb (79.8 kg)    Physical  Exam Vitals and nursing note reviewed.  Constitutional:      General: He is not in acute distress.    Appearance: Normal appearance.  HENT:     Head: Normocephalic and atraumatic.     Right Ear: Tympanic membrane, ear canal and external ear normal.     Left Ear: Tympanic membrane, ear canal and external ear normal.     Ears:     Comments: Moderate cerumen in right ear    Mouth/Throat:     Mouth: Mucous membranes are moist.     Pharynx: Oropharynx is clear.  Eyes:     Conjunctiva/sclera: Conjunctivae normal.  Cardiovascular:     Rate and Rhythm: Normal rate and regular rhythm.     Heart sounds: Normal heart sounds.  Pulmonary:     Effort: Pulmonary effort is normal. No respiratory distress.     Breath sounds: Normal breath sounds.  Abdominal:     General: Abdomen is flat.     Palpations: Abdomen is soft. There is no mass.     Tenderness: There is no abdominal tenderness.  Musculoskeletal:     Cervical back: Normal range of motion and neck supple.     Right lower leg: No edema.     Left lower leg: No edema.  Skin:    General: Skin is warm and dry.  Neurological:     Mental Status: He is alert and oriented to person, place, and time.  Psychiatric:        Mood and Affect: Mood normal.        Behavior: Behavior normal.    Assessment & Plan:   Problem List Items Addressed This Visit       Cardiovascular and Mediastinum   Essential hypertension (Chronic)   Relevant Medications   amLODipine (NORVASC) 5 MG tablet   pravastatin (PRAVACHOL) 80 MG tablet   Other Relevant Orders   CMP14+EGFR     Other   Hyperlipidemia with target LDL less than 100 (Chronic)   Relevant Medications   amLODipine (NORVASC) 5 MG tablet   pravastatin (PRAVACHOL) 80 MG tablet   Other Relevant Orders   Lipid panel   Other Visit Diagnoses     Well adult exam    -  Primary   Relevant Orders   CBC with Differential/Platelet   CMP14+EGFR   Lipid panel   PSA, total and free   Benign  prostatic hyperplasia with nocturia       Relevant Orders   PSA, total and free       Blood pressure well-controlled according to home blood pressure monitoring. Will continue amlodipine 5 mg daily. Patient tolerating pravastatin well. Will recheck lipids today. Recommended patient schedule an appointment with his ophthalmologist since he is experiencing more symptoms from  what is likely cataracts. Recommended patient use OTC wax softener to help clear cerumen from right ear. Recommended patient use OTC hemorrhoid cream to help reduce the bleeding and pruritus during flares. Can consider starting Flomax if BPH symptoms worsen or start interfering with his sleep. Will recheck PSA today.  Follow up plan: Return in about 1 year (around 10/19/2024), or if symptoms worsen or fail to improve, for Physical exam.  Counseling provided for all of the vaccine components Orders Placed This Encounter  Procedures   CBC with Differential/Platelet   CMP14+EGFR   Lipid panel   PSA, total and free    Gillermina Phy, Medical Student [facility] 10/20/2023, 10:23 AM

## 2023-10-21 LAB — CBC WITH DIFFERENTIAL/PLATELET
Basophils Absolute: 0.1 10*3/uL (ref 0.0–0.2)
Basos: 1 %
Eos: 3 %
Hematocrit: 51.3 % — ABNORMAL HIGH (ref 37.5–51.0)
Hemoglobin: 16.9 g/dL (ref 13.0–17.7)
Immature Granulocytes: 0.1 10*3/uL (ref 0.0–0.1)
Immature Granulocytes: 1 %
Lymphocytes Absolute: 2.1 10*3/uL (ref 0.7–3.1)
Lymphs: 24 %
MCH: 31.2 pg (ref 26.6–33.0)
MCHC: 32.9 g/dL (ref 31.5–35.7)
MCV: 95 fL (ref 79–97)
Monocytes Absolute: 0.3 10*3/uL (ref 0.0–0.4)
Monocytes Absolute: 0.7 10*3/uL (ref 0.1–0.9)
Monocytes: 8 %
Neutrophils Absolute: 5.5 10*3/uL (ref 1.4–7.0)
Neutrophils: 63 %
Platelets: 134 10*3/uL — ABNORMAL LOW (ref 150–450)
RBC: 5.41 x10E6/uL (ref 4.14–5.80)
RDW: 12 % (ref 11.6–15.4)
WBC: 8.9 10*3/uL (ref 3.4–10.8)

## 2023-10-21 LAB — LIPID PANEL
Chol/HDL Ratio: 4.5 ratio (ref 0.0–5.0)
Cholesterol, Total: 168 mg/dL (ref 100–199)
HDL: 37 mg/dL — ABNORMAL LOW (ref >39)
LDL Chol Calc (NIH): 102 mg/dL — ABNORMAL HIGH (ref 0–99)
Triglycerides: 168 mg/dL — ABNORMAL HIGH (ref 0–149)
VLDL Cholesterol Cal: 29 mg/dL (ref 5–40)

## 2023-10-21 LAB — CMP14+EGFR
ALT: 32 IU/L (ref 0–44)
AST: 28 IU/L (ref 0–40)
Albumin: 4.5 g/dL (ref 3.8–4.9)
Alkaline Phosphatase: 106 IU/L (ref 44–121)
BUN/Creatinine Ratio: 14 (ref 10–24)
BUN: 14 mg/dL (ref 8–27)
Bilirubin Total: 0.5 mg/dL (ref 0.0–1.2)
CO2: 24 mmol/L (ref 20–29)
Calcium: 9.9 mg/dL (ref 8.6–10.2)
Chloride: 99 mmol/L (ref 96–106)
Creatinine, Ser: 0.97 mg/dL (ref 0.76–1.27)
Globulin, Total: 3.1 g/dL (ref 1.5–4.5)
Glucose: 114 mg/dL — ABNORMAL HIGH (ref 70–99)
Potassium: 4.5 mmol/L (ref 3.5–5.2)
Sodium: 138 mmol/L (ref 134–144)
Total Protein: 7.6 g/dL (ref 6.0–8.5)
eGFR: 89 mL/min/{1.73_m2} (ref >59)

## 2023-10-21 LAB — PSA, TOTAL AND FREE
PSA, Free Pct: 24.4 %
PSA, Free: 0.39 ng/mL
Prostate Specific Ag, Serum: 1.6 ng/mL (ref 0.0–4.0)

## 2024-10-02 ENCOUNTER — Other Ambulatory Visit: Payer: Self-pay | Admitting: Family Medicine

## 2024-10-02 DIAGNOSIS — I10 Essential (primary) hypertension: Secondary | ICD-10-CM

## 2024-10-04 ENCOUNTER — Other Ambulatory Visit: Payer: Self-pay | Admitting: Family Medicine

## 2024-10-04 DIAGNOSIS — I10 Essential (primary) hypertension: Secondary | ICD-10-CM

## 2024-10-05 NOTE — Telephone Encounter (Signed)
 Name from pharmacy: AMLODIPINE  BESYLATE 5 MG TAB   Pharmacy comment: Alternative Requested:NOT COVERED FOR MORE THAN 1 TABLET DAILY

## 2024-10-20 ENCOUNTER — Encounter: Payer: Self-pay | Admitting: Family Medicine

## 2024-10-20 ENCOUNTER — Ambulatory Visit: Payer: BC Managed Care – PPO | Admitting: Family Medicine

## 2024-10-20 VITALS — BP 130/80 | HR 72 | Temp 97.5°F | Ht 68.0 in | Wt 168.0 lb

## 2024-10-20 DIAGNOSIS — R739 Hyperglycemia, unspecified: Secondary | ICD-10-CM | POA: Diagnosis not present

## 2024-10-20 DIAGNOSIS — Z0000 Encounter for general adult medical examination without abnormal findings: Secondary | ICD-10-CM

## 2024-10-20 DIAGNOSIS — I10 Essential (primary) hypertension: Secondary | ICD-10-CM | POA: Diagnosis not present

## 2024-10-20 DIAGNOSIS — E785 Hyperlipidemia, unspecified: Secondary | ICD-10-CM | POA: Diagnosis not present

## 2024-10-20 DIAGNOSIS — Z23 Encounter for immunization: Secondary | ICD-10-CM

## 2024-10-20 DIAGNOSIS — Z0001 Encounter for general adult medical examination with abnormal findings: Secondary | ICD-10-CM

## 2024-10-20 DIAGNOSIS — Z125 Encounter for screening for malignant neoplasm of prostate: Secondary | ICD-10-CM

## 2024-10-20 LAB — BAYER DCA HB A1C WAIVED: HB A1C (BAYER DCA - WAIVED): 5.6 % (ref 4.8–5.6)

## 2024-10-20 MED ORDER — AMLODIPINE BESYLATE 10 MG PO TABS: 5.0000 mg | ORAL_TABLET | Freq: Two times a day (BID) | ORAL | 3 refills | Status: AC

## 2024-10-20 MED ORDER — PRAVASTATIN SODIUM 80 MG PO TABS: 80.0000 mg | ORAL_TABLET | Freq: Every day | ORAL | 3 refills | Status: AC

## 2024-10-20 NOTE — Progress Notes (Signed)
 BP 130/80   Pulse 72   Temp (!) 97.5 F (36.4 C)   Ht 5' 8 (1.727 m)   Wt 168 lb (76.2 kg)   SpO2 98%   BMI 25.54 kg/m    Subjective:   Patient ID: Brian Walters, male    DOB: Mar 26, 1961, 63 y.o.   MRN: [id number]  HPI: Brian Walters is a 63 y.o. male presenting on 10/20/2024 for Medical Management of Chronic Issues, Hypertension, and Hyperlipidemia   Discussed the use of AI scribe software for clinical note transcription with the patient, who gave verbal consent to proceed.  History of Present Illness   Brian Walters is a 63 year old male who presents for an annual physical exam and checkup.  Cutaneous lesion - Sunspot present on hand - Monitoring for changes in appearance  Hypertension - Takes blood pressure medication 10 mg daily, split into half in the morning and half in the evening due to insurance coverage issues - Home blood pressure readings in the 120s/80s mmHg range - No diastolic readings higher than 85 mmHg  Hyperlipidemia - Takes pravastatin  for cholesterol management  Postoperative pain and pruritus - Pain at site of previous hernia surgery when stretching, resolves with cessation of activity - Itching between testicles and rectum ongoing for approximately two years, attributed to nerve issues post-surgery - Occasional rash on buttocks described as 'like pimples,' self-resolving  Hemorrhoidal symptoms - Occasional pruritus of hemorrhoids - Uses hydrocortisone cream for relief  Genitourinary symptoms - No urinary symptoms including difficulty with stream, initiation, or emptying - No abdominal pain except at site of previous hernia surgery          Relevant past medical, surgical, family and social history reviewed and updated as indicated. Interim medical history since our last visit reviewed. Allergies and medications reviewed and updated.  Review of Systems  Constitutional:  Negative for chills and fever.  Eyes:  Negative for  visual disturbance.  Respiratory:  Negative for shortness of breath and wheezing.   Cardiovascular:  Negative for chest pain and leg swelling.  Musculoskeletal:  Positive for arthralgias. Negative for back pain and gait problem.  Skin:  Negative for rash.  Neurological:  Negative for dizziness and numbness.  All other systems reviewed and are negative.   Per HPI unless specifically indicated above   Allergies as of 10/20/2024   No Known Allergies      Medication List        Accurate as of October 20, 2024  8:38 AM. If you have any questions, ask your nurse or doctor.          amLODipine  10 MG tablet Commonly known as: NORVASC  Take 0.5 tablets (5 mg total) by mouth 2 (two) times daily.   pravastatin  80 MG tablet Commonly known as: PRAVACHOL  Take 1 tablet (80 mg total) by mouth daily.         Objective:   BP 130/80   Pulse 72   Temp (!) 97.5 F (36.4 C)   Ht 5' 8 (1.727 m)   Wt 168 lb (76.2 kg)   SpO2 98%   BMI 25.54 kg/m   Wt Readings from Last 3 Encounters:  10/20/24 168 lb (76.2 kg)  10/20/23 175 lb (79.4 kg)  09/22/23 173 lb 8 oz (78.7 kg)    Physical Exam Physical Exam   VITALS: BP- 130/80 CHEST: Lungs clear to auscultation bilaterally. CARDIOVASCULAR: Regular rate and rhythm, no murmurs. EXTREMITIES: No edema in lower extremities.  SKIN: Sunspot observed, consistent with sun damage.         Assessment & Plan:   Problem List Items Addressed This Visit       Cardiovascular and Mediastinum   Essential hypertension (Chronic)   Relevant Medications   amLODipine  (NORVASC ) 10 MG tablet   pravastatin  (PRAVACHOL ) 80 MG tablet   Other Relevant Orders   CBC with Differential/Platelet   CMP14+EGFR     Other   Hyperlipidemia with target LDL less than 100 (Chronic)   Relevant Medications   amLODipine  (NORVASC ) 10 MG tablet   pravastatin  (PRAVACHOL ) 80 MG tablet   Other Relevant Orders   Lipid panel   Other Visit Diagnoses       Physical  exam    -  Primary   Relevant Orders   CBC with Differential/Platelet   CMP14+EGFR   Lipid panel   PSA, total and free     Prostate cancer screening       Relevant Orders   PSA, total and free     Elevated random blood glucose level       Relevant Orders   Bayer DCA Hb A1c Waived          Essential hypertension Blood pressure controlled with current regimen. Home readings 120s/80s, office 130/80 mmHg. Medication adjusted for insurance, using 10 mg tablets split into two doses. - Continue antihypertensive regimen with 10 mg tablets, half in morning and evening.  Hyperlipidemia Continues on pravastatin  for cholesterol management. - Continue pravastatin  as prescribed.  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 Symptoms suggest possible permanent nerve damage. - Use hydrocortisone cream for itching. - Monitor symptoms and report significant changes.  Hemorrhoids Intermittent itching with no significant exacerbation. - Use hydrocortisone cream for itching and hemorrhoid management. - Consider Preparation H daily for two weeks.  Benign skin lesion (sunspot) Sunspot on hand likely due to sun damage, considered benign. - Monitor sunspot for changes in size or appearance. Report changes immediately.  Screening for malignant neoplasm of prostate Prostate cancer screening planned. No urinary symptoms.          Follow up plan: Return in about 1 year (around 10/20/2025), or if symptoms worsen or fail to improve, for Physical exam.  Counseling provided for all of the vaccine components Orders Placed This Encounter  Procedures   CBC with Differential/Platelet   CMP14+EGFR   Lipid panel   PSA, total and free   Bayer DCA Hb A1c Waived    Fonda Levins, MD [facility] 10/20/2024, 8:38 AM

## 2024-10-21 LAB — CBC WITH DIFFERENTIAL/PLATELET
Basophils Absolute: 0.1 x10E3/uL (ref 0.0–0.2)
Basos: 1 %
EOS (ABSOLUTE): 0.3 x10E3/uL (ref 0.0–0.4)
Eos: 4 %
Hematocrit: 47.7 % (ref 37.5–51.0)
Hemoglobin: 16.3 g/dL (ref 13.0–17.7)
Immature Grans (Abs): 0 x10E3/uL (ref 0.0–0.1)
Immature Granulocytes: 0 %
Lymphocytes Absolute: 1.7 x10E3/uL (ref 0.7–3.1)
Lymphs: 23 %
MCH: 32 pg (ref 26.6–33.0)
MCHC: 34.2 g/dL (ref 31.5–35.7)
MCV: 94 fL (ref 79–97)
Monocytes Absolute: 0.7 x10E3/uL (ref 0.1–0.9)
Monocytes: 9 %
Neutrophils Absolute: 4.7 x10E3/uL (ref 1.4–7.0)
Neutrophils: 63 %
Platelets: 144 x10E3/uL — ABNORMAL LOW (ref 150–450)
RBC: 5.09 x10E6/uL (ref 4.14–5.80)
RDW: 12.5 % (ref 11.6–15.4)
WBC: 7.4 x10E3/uL (ref 3.4–10.8)

## 2024-10-21 LAB — CMP14+EGFR
ALT: 23 IU/L (ref 0–44)
AST: 22 IU/L (ref 0–40)
Albumin: 4.2 g/dL (ref 3.9–4.9)
Alkaline Phosphatase: 100 IU/L (ref 47–123)
BUN/Creatinine Ratio: 9 — ABNORMAL LOW (ref 10–24)
BUN: 9 mg/dL (ref 8–27)
Bilirubin Total: 0.5 mg/dL (ref 0.0–1.2)
CO2: 22 mmol/L (ref 20–29)
Calcium: 9.6 mg/dL (ref 8.6–10.2)
Chloride: 103 mmol/L (ref 96–106)
Creatinine, Ser: 1.01 mg/dL (ref 0.76–1.27)
Globulin, Total: 2.8 g/dL (ref 1.5–4.5)
Glucose: 118 mg/dL — ABNORMAL HIGH (ref 70–99)
Potassium: 4.2 mmol/L (ref 3.5–5.2)
Sodium: 140 mmol/L (ref 134–144)
Total Protein: 7 g/dL (ref 6.0–8.5)
eGFR: 85 mL/min/1.73 (ref >59)

## 2024-10-21 LAB — PSA, TOTAL AND FREE
PSA, Free Pct: 20 %
PSA, Free: 0.36 ng/mL
Prostate Specific Ag, Serum: 1.8 ng/mL (ref 0.0–4.0)

## 2024-10-21 LAB — LIPID PANEL
Chol/HDL Ratio: 3.3 ratio (ref 0.0–5.0)
Cholesterol, Total: 119 mg/dL (ref 100–199)
HDL: 36 mg/dL — ABNORMAL LOW (ref >39)
LDL Chol Calc (NIH): 67 mg/dL (ref 0–99)
Triglycerides: 81 mg/dL (ref 0–149)
VLDL Cholesterol Cal: 16 mg/dL (ref 5–40)

## 2024-10-27 ENCOUNTER — Ambulatory Visit: Payer: Self-pay | Admitting: Family Medicine

## 2025-10-21 ENCOUNTER — Encounter: Admitting: Family Medicine
